# Patient Record
Sex: Male | Born: 1955 | Race: Black or African American | Hispanic: No | Marital: Married
Health system: Southern US, Community
[De-identification: ages and names within clinical notes are randomized; demographics above are authoritative.]

---

## 2007-07-07 ENCOUNTER — Emergency Department: Payer: Self-pay | Admitting: Emergency Medicine

## 2007-07-30 ENCOUNTER — Ambulatory Visit: Payer: Self-pay

## 2012-03-11 ENCOUNTER — Ambulatory Visit: Payer: Self-pay | Admitting: Internal Medicine

## 2012-03-23 ENCOUNTER — Inpatient Hospital Stay: Payer: Self-pay | Admitting: Surgery

## 2012-03-23 LAB — BASIC METABOLIC PANEL
Anion Gap: 9 (ref 7–16)
Co2: 25 mmol/L (ref 21–32)
Creatinine: 0.98 mg/dL (ref 0.60–1.30)
EGFR (African American): >60
EGFR (Non-African Amer.): >60
Glucose: 93 mg/dL (ref 65–99)
Potassium: 4.2 mmol/L (ref 3.5–5.1)
Sodium: 139 mmol/L (ref 136–145)

## 2012-03-23 LAB — CBC
HGB: 17.2 g/dL (ref 13.0–18.0)
Platelet: 297 10*3/uL (ref 150–440)
RBC: 6.16 10*6/uL — ABNORMAL HIGH (ref 4.40–5.90)

## 2012-03-24 LAB — CBC WITH DIFFERENTIAL/PLATELET
Basophil #: 0.1 10*3/uL (ref 0.0–0.1)
Eosinophil %: 1 %
Lymphocyte %: 12.8 %
MCH: 27.5 pg (ref 26.0–34.0)
MCHC: 33.4 g/dL (ref 32.0–36.0)
Monocyte %: 3 %
Neutrophil #: 17 10*3/uL — ABNORMAL HIGH (ref 1.4–6.5)
Platelet: 281 10*3/uL (ref 150–440)
RBC: 6.24 10*6/uL — ABNORMAL HIGH (ref 4.40–5.90)
RDW: 14.5 % (ref 11.5–14.5)

## 2012-03-24 LAB — COMPREHENSIVE METABOLIC PANEL
Albumin: 2.9 g/dL — ABNORMAL LOW (ref 3.4–5.0)
Alkaline Phosphatase: 98 U/L (ref 50–136)
Bilirubin,Total: 0.8 mg/dL (ref 0.2–1.0)
Calcium, Total: 8.3 mg/dL — ABNORMAL LOW (ref 8.5–10.1)
Chloride: 107 mmol/L (ref 98–107)
Co2: 23 mmol/L (ref 21–32)
Creatinine: 0.97 mg/dL (ref 0.60–1.30)
EGFR (African American): >60
EGFR (Non-African Amer.): >60
Glucose: 100 mg/dL — ABNORMAL HIGH (ref 65–99)
SGPT (ALT): 25 U/L (ref 12–78)

## 2012-03-24 LAB — PROTIME-INR: Prothrombin Time: 14.2 secs (ref 11.5–14.7)

## 2012-03-25 LAB — COMPREHENSIVE METABOLIC PANEL
Albumin: 2.9 g/dL — ABNORMAL LOW (ref 3.4–5.0)
Alkaline Phosphatase: 86 U/L (ref 50–136)
Anion Gap: 4 — ABNORMAL LOW (ref 7–16)
Calcium, Total: 8.8 mg/dL (ref 8.5–10.1)
Chloride: 104 mmol/L (ref 98–107)
Co2: 30 mmol/L (ref 21–32)
EGFR (African American): >60
EGFR (Non-African Amer.): >60
Glucose: 101 mg/dL — ABNORMAL HIGH (ref 65–99)
Osmolality: 275 (ref 275–301)
Potassium: 4.2 mmol/L (ref 3.5–5.1)
SGOT(AST): 16 U/L (ref 15–37)
Sodium: 138 mmol/L (ref 136–145)

## 2012-03-25 LAB — CBC WITH DIFFERENTIAL/PLATELET
Basophil #: 0.1 10*3/uL (ref 0.0–0.1)
Eosinophil #: 0.3 10*3/uL (ref 0.0–0.7)
Eosinophil %: 4 %
Lymphocyte #: 2.6 10*3/uL (ref 1.0–3.6)
Lymphocyte %: 34.7 %
MCH: 27.4 pg (ref 26.0–34.0)
MCHC: 33 g/dL (ref 32.0–36.0)
Monocyte #: 0.6 x10 3/mm (ref 0.2–1.0)
Neutrophil %: 52.6 %
Platelet: 264 10*3/uL (ref 150–440)
RDW: 14.3 % (ref 11.5–14.5)

## 2012-03-30 LAB — CBC WITH DIFFERENTIAL/PLATELET
Eosinophil %: 7.5 %
HCT: 50.1 % (ref 40.0–52.0)
HGB: 16.6 g/dL (ref 13.0–18.0)
Lymphocyte #: 2.5 10*3/uL (ref 1.0–3.6)
MCH: 27.1 pg (ref 26.0–34.0)
MCHC: 33.1 g/dL (ref 32.0–36.0)
MCV: 82 fL (ref 80–100)
Monocyte #: 0.5 x10 3/mm (ref 0.2–1.0)
Monocyte %: 6.8 %
Neutrophil #: 3.2 10*3/uL (ref 1.4–6.5)
Platelet: 337 10*3/uL (ref 150–440)
RBC: 6.12 10*6/uL — ABNORMAL HIGH (ref 4.40–5.90)
WBC: 6.7 10*3/uL (ref 3.8–10.6)

## 2012-03-30 LAB — BASIC METABOLIC PANEL
Calcium, Total: 9.1 mg/dL (ref 8.5–10.1)
Co2: 31 mmol/L (ref 21–32)
EGFR (African American): >60
EGFR (Non-African Amer.): >60
Potassium: 4 mmol/L (ref 3.5–5.1)
Sodium: 138 mmol/L (ref 136–145)

## 2012-04-02 DIAGNOSIS — I472 Ventricular tachycardia: Secondary | ICD-10-CM

## 2012-04-02 LAB — BASIC METABOLIC PANEL
Anion Gap: 5 — ABNORMAL LOW (ref 7–16)
BUN: 24 mg/dL — ABNORMAL HIGH (ref 7–18)
Calcium, Total: 9.2 mg/dL (ref 8.5–10.1)
Chloride: 103 mmol/L (ref 98–107)
Co2: 28 mmol/L (ref 21–32)
Creatinine: 1.1 mg/dL (ref 0.60–1.30)
EGFR (African American): >60
Sodium: 136 mmol/L (ref 136–145)

## 2012-04-02 LAB — CBC WITH DIFFERENTIAL/PLATELET
Basophil #: 0.1 10*3/uL (ref 0.0–0.1)
Basophil %: 0.7 %
Eosinophil #: 0.4 10*3/uL (ref 0.0–0.7)
Eosinophil %: 2.7 %
HCT: 53.3 % — ABNORMAL HIGH (ref 40.0–52.0)
HGB: 17.8 g/dL (ref 13.0–18.0)
Lymphocyte #: 2.2 10*3/uL (ref 1.0–3.6)
MCH: 27.7 pg (ref 26.0–34.0)
MCV: 83 fL (ref 80–100)
Monocyte #: 0.2 x10 3/mm (ref 0.2–1.0)
Neutrophil #: 12.2 10*3/uL — ABNORMAL HIGH (ref 1.4–6.5)
RDW: 14.5 % (ref 11.5–14.5)

## 2012-04-03 LAB — PATHOLOGY REPORT

## 2012-04-03 LAB — POTASSIUM: Potassium: 4.1 mmol/L (ref 3.5–5.1)

## 2012-04-07 LAB — BASIC METABOLIC PANEL
Anion Gap: 5 — ABNORMAL LOW (ref 7–16)
BUN: 9 mg/dL (ref 7–18)
Calcium, Total: 8.4 mg/dL — ABNORMAL LOW (ref 8.5–10.1)
Chloride: 105 mmol/L (ref 98–107)
Co2: 31 mmol/L (ref 21–32)
Creatinine: 0.88 mg/dL (ref 0.60–1.30)
EGFR (African American): >60
EGFR (Non-African Amer.): >60
Sodium: 141 mmol/L (ref 136–145)

## 2012-04-07 LAB — CBC WITH DIFFERENTIAL/PLATELET
Basophil %: 1.2 %
Eosinophil %: 13.4 %
HGB: 12.9 g/dL — ABNORMAL LOW (ref 13.0–18.0)
Lymphocyte #: 2.3 10*3/uL (ref 1.0–3.6)
MCH: 26.2 pg (ref 26.0–34.0)
MCV: 82 fL (ref 80–100)
Monocyte #: 0.5 x10 3/mm (ref 0.2–1.0)
Platelet: 351 10*3/uL (ref 150–440)
RBC: 4.92 10*6/uL (ref 4.40–5.90)
RDW: 14.1 % (ref 11.5–14.5)

## 2012-04-11 ENCOUNTER — Ambulatory Visit: Payer: Self-pay | Admitting: Internal Medicine

## 2012-07-09 ENCOUNTER — Emergency Department: Payer: Self-pay | Admitting: Unknown Physician Specialty

## 2013-07-10 ENCOUNTER — Ambulatory Visit: Payer: Self-pay | Admitting: Internal Medicine

## 2013-07-21 ENCOUNTER — Inpatient Hospital Stay: Payer: Self-pay | Admitting: Internal Medicine

## 2013-07-21 LAB — CBC
HCT: 45.7 % (ref 40.0–52.0)
HGB: 14.8 g/dL (ref 13.0–18.0)
MCH: 24.8 pg — ABNORMAL LOW (ref 26.0–34.0)
MCHC: 32.3 g/dL (ref 32.0–36.0)
MCV: 77 fL — AB (ref 80–100)
Platelet: 357 10*3/uL (ref 150–440)
RBC: 5.96 10*6/uL — ABNORMAL HIGH (ref 4.40–5.90)
RDW: 18.9 % — ABNORMAL HIGH (ref 11.5–14.5)
WBC: 7.1 10*3/uL (ref 3.8–10.6)

## 2013-07-21 LAB — COMPREHENSIVE METABOLIC PANEL
ALBUMIN: 3.3 g/dL — AB (ref 3.4–5.0)
Alkaline Phosphatase: 111 U/L
Anion Gap: 7 (ref 7–16)
BUN: 13 mg/dL (ref 7–18)
Bilirubin,Total: 0.6 mg/dL (ref 0.2–1.0)
CO2: 29 mmol/L (ref 21–32)
Calcium, Total: 9.8 mg/dL (ref 8.5–10.1)
Chloride: 99 mmol/L (ref 98–107)
Creatinine: 0.85 mg/dL (ref 0.60–1.30)
EGFR (Non-African Amer.): >60
GLUCOSE: 119 mg/dL — AB (ref 65–99)
Osmolality: 271 (ref 275–301)
POTASSIUM: 4.3 mmol/L (ref 3.5–5.1)
SGOT(AST): 49 U/L — ABNORMAL HIGH (ref 15–37)
SGPT (ALT): 48 U/L (ref 12–78)
Sodium: 135 mmol/L — ABNORMAL LOW (ref 136–145)
TOTAL PROTEIN: 8.2 g/dL (ref 6.4–8.2)

## 2013-07-21 LAB — TROPONIN I

## 2013-07-22 LAB — CBC WITH DIFFERENTIAL/PLATELET
Basophil #: 0 10*3/uL (ref 0.0–0.1)
Basophil %: 0.5 %
EOS PCT: 2.5 %
Eosinophil #: 0.1 10*3/uL (ref 0.0–0.7)
HCT: 36.3 % — ABNORMAL LOW (ref 40.0–52.0)
HGB: 11.7 g/dL — ABNORMAL LOW (ref 13.0–18.0)
LYMPHS PCT: 5.4 %
Lymphocyte #: 0.2 10*3/uL — ABNORMAL LOW (ref 1.0–3.6)
MCH: 24.6 pg — ABNORMAL LOW (ref 26.0–34.0)
MCHC: 32.2 g/dL (ref 32.0–36.0)
MCV: 77 fL — ABNORMAL LOW (ref 80–100)
Monocyte #: 0.4 x10 3/mm (ref 0.2–1.0)
Monocyte %: 9.8 %
Neutrophil #: 3.5 10*3/uL (ref 1.4–6.5)
Neutrophil %: 81.8 %
PLATELETS: 237 10*3/uL (ref 150–440)
RBC: 4.75 10*6/uL (ref 4.40–5.90)
RDW: 18.1 % — AB (ref 11.5–14.5)
WBC: 4.3 10*3/uL (ref 3.8–10.6)

## 2013-07-22 LAB — MAGNESIUM: Magnesium: 1.5 mg/dL — ABNORMAL LOW

## 2013-07-23 LAB — CREATININE, SERUM
CREATININE: 1.04 mg/dL (ref 0.60–1.30)
EGFR (African American): >60

## 2013-07-24 LAB — VANCOMYCIN, TROUGH: VANCOMYCIN, TROUGH: 34 ug/mL — AB (ref 10–20)

## 2013-07-25 LAB — BASIC METABOLIC PANEL
ANION GAP: 8 (ref 7–16)
BUN: 32 mg/dL — AB (ref 7–18)
CHLORIDE: 105 mmol/L (ref 98–107)
Calcium, Total: 8.8 mg/dL (ref 8.5–10.1)
Co2: 26 mmol/L (ref 21–32)
Creatinine: 4.09 mg/dL — ABNORMAL HIGH (ref 0.60–1.30)
EGFR (African American): 18 — ABNORMAL LOW
EGFR (Non-African Amer.): 15 — ABNORMAL LOW
GLUCOSE: 113 mg/dL — AB (ref 65–99)
Osmolality: 285 (ref 275–301)
POTASSIUM: 4.4 mmol/L (ref 3.5–5.1)
SODIUM: 139 mmol/L (ref 136–145)

## 2013-07-25 LAB — PROTEIN / CREATININE RATIO, URINE
Creatinine, Urine: 46.3 mg/dL (ref 30.0–125.0)
Protein, Random Urine: 22 mg/dL — ABNORMAL HIGH (ref 0–12)
Protein/Creat. Ratio: 475 mg/gCREAT — ABNORMAL HIGH (ref 0–200)

## 2013-07-25 LAB — URINALYSIS, COMPLETE
BILIRUBIN, UR: NEGATIVE
Glucose,UR: NEGATIVE mg/dL (ref 0–75)
Ketone: NEGATIVE
LEUKOCYTE ESTERASE: NEGATIVE
Nitrite: NEGATIVE
PROTEIN: NEGATIVE
Ph: 5 (ref 4.5–8.0)
RBC,UR: <1 /HPF (ref 0–5)
SPECIFIC GRAVITY: 1.006 (ref 1.003–1.030)
Squamous Epithelial: <1
WBC UR: NONE SEEN /HPF (ref 0–5)

## 2013-07-25 LAB — CREATININE, SERUM
Creatinine: 3.9 mg/dL — ABNORMAL HIGH (ref 0.60–1.30)
EGFR (African American): 19 — ABNORMAL LOW
EGFR (Non-African Amer.): 16 — ABNORMAL LOW

## 2013-07-25 LAB — VANCOMYCIN, TROUGH: Vancomycin, Trough: 18 ug/mL (ref 10–20)

## 2013-07-26 LAB — CBC WITH DIFFERENTIAL/PLATELET
BASOS PCT: 0 %
Basophil #: 0 10*3/uL (ref 0.0–0.1)
Eosinophil #: 0 10*3/uL (ref 0.0–0.7)
Eosinophil %: 0.1 %
HCT: 36.6 % — AB (ref 40.0–52.0)
HGB: 11.7 g/dL — ABNORMAL LOW (ref 13.0–18.0)
Lymphocyte #: 0.1 10*3/uL — ABNORMAL LOW (ref 1.0–3.6)
Lymphocyte %: 1.7 %
MCH: 24.4 pg — ABNORMAL LOW (ref 26.0–34.0)
MCHC: 32 g/dL (ref 32.0–36.0)
MCV: 76 fL — ABNORMAL LOW (ref 80–100)
MONOS PCT: 1.5 %
Monocyte #: 0.1 x10 3/mm — ABNORMAL LOW (ref 0.2–1.0)
Neutrophil #: 7 10*3/uL — ABNORMAL HIGH (ref 1.4–6.5)
Neutrophil %: 96.7 %
PLATELETS: 206 10*3/uL (ref 150–440)
RBC: 4.8 10*6/uL (ref 4.40–5.90)
RDW: 18.6 % — AB (ref 11.5–14.5)
WBC: 7.2 10*3/uL (ref 3.8–10.6)

## 2013-07-26 LAB — UR PROT ELECTROPHORESIS, URINE RANDOM

## 2013-07-26 LAB — BASIC METABOLIC PANEL
Anion Gap: 10 (ref 7–16)
BUN: 44 mg/dL — ABNORMAL HIGH (ref 7–18)
CALCIUM: 9.1 mg/dL (ref 8.5–10.1)
CHLORIDE: 102 mmol/L (ref 98–107)
CREATININE: 4.58 mg/dL — AB (ref 0.60–1.30)
Co2: 24 mmol/L (ref 21–32)
EGFR (African American): 15 — ABNORMAL LOW
EGFR (Non-African Amer.): 13 — ABNORMAL LOW
GLUCOSE: 138 mg/dL — AB (ref 65–99)
Osmolality: 285 (ref 275–301)
POTASSIUM: 4.7 mmol/L (ref 3.5–5.1)
Sodium: 136 mmol/L (ref 136–145)

## 2013-07-26 LAB — CULTURE, BLOOD (SINGLE)

## 2013-07-27 LAB — BASIC METABOLIC PANEL
ANION GAP: 11 (ref 7–16)
BUN: 57 mg/dL — ABNORMAL HIGH (ref 7–18)
CREATININE: 4.15 mg/dL — AB (ref 0.60–1.30)
Calcium, Total: 8.5 mg/dL (ref 8.5–10.1)
Chloride: 106 mmol/L (ref 98–107)
Co2: 18 mmol/L — ABNORMAL LOW (ref 21–32)
EGFR (African American): 17 — ABNORMAL LOW
EGFR (Non-African Amer.): 15 — ABNORMAL LOW
GLUCOSE: 156 mg/dL — AB (ref 65–99)
Osmolality: 289 (ref 275–301)
Potassium: 4.7 mmol/L (ref 3.5–5.1)
SODIUM: 135 mmol/L — AB (ref 136–145)

## 2013-07-28 LAB — BASIC METABOLIC PANEL
Anion Gap: 9 (ref 7–16)
BUN: 64 mg/dL — ABNORMAL HIGH (ref 7–18)
CALCIUM: 8.7 mg/dL (ref 8.5–10.1)
CHLORIDE: 108 mmol/L — AB (ref 98–107)
Co2: 21 mmol/L (ref 21–32)
Creatinine: 3.93 mg/dL — ABNORMAL HIGH (ref 0.60–1.30)
EGFR (African American): 18 — ABNORMAL LOW
EGFR (Non-African Amer.): 16 — ABNORMAL LOW
Glucose: 114 mg/dL — ABNORMAL HIGH (ref 65–99)
Osmolality: 295 (ref 275–301)
Potassium: 4.7 mmol/L (ref 3.5–5.1)
SODIUM: 138 mmol/L (ref 136–145)

## 2013-07-29 LAB — BASIC METABOLIC PANEL
ANION GAP: 9 (ref 7–16)
BUN: 64 mg/dL — ABNORMAL HIGH (ref 7–18)
CALCIUM: 8.9 mg/dL (ref 8.5–10.1)
CO2: 21 mmol/L (ref 21–32)
Chloride: 110 mmol/L — ABNORMAL HIGH (ref 98–107)
Creatinine: 3.38 mg/dL — ABNORMAL HIGH (ref 0.60–1.30)
EGFR (African American): 22 — ABNORMAL LOW
EGFR (Non-African Amer.): 19 — ABNORMAL LOW
Glucose: 117 mg/dL — ABNORMAL HIGH (ref 65–99)
OSMOLALITY: 299 (ref 275–301)
Potassium: 5.1 mmol/L (ref 3.5–5.1)
Sodium: 140 mmol/L (ref 136–145)

## 2013-07-29 LAB — PROTEIN ELECTROPHORESIS(ARMC)

## 2013-07-30 LAB — BASIC METABOLIC PANEL
ANION GAP: 9 (ref 7–16)
BUN: 63 mg/dL — AB (ref 7–18)
Calcium, Total: 9.3 mg/dL (ref 8.5–10.1)
Chloride: 112 mmol/L — ABNORMAL HIGH (ref 98–107)
Co2: 21 mmol/L (ref 21–32)
Creatinine: 3.09 mg/dL — ABNORMAL HIGH (ref 0.60–1.30)
EGFR (Non-African Amer.): 21 — ABNORMAL LOW
GFR CALC AF AMER: 25 — AB
GLUCOSE: 105 mg/dL — AB (ref 65–99)
Osmolality: 301 (ref 275–301)
Potassium: 4.6 mmol/L (ref 3.5–5.1)
SODIUM: 142 mmol/L (ref 136–145)

## 2013-07-31 LAB — BASIC METABOLIC PANEL
Anion Gap: 7 (ref 7–16)
BUN: 54 mg/dL — ABNORMAL HIGH (ref 7–18)
CHLORIDE: 111 mmol/L — AB (ref 98–107)
CO2: 23 mmol/L (ref 21–32)
CREATININE: 2.66 mg/dL — AB (ref 0.60–1.30)
Calcium, Total: 9.2 mg/dL (ref 8.5–10.1)
EGFR (Non-African Amer.): 25 — ABNORMAL LOW
GFR CALC AF AMER: 30 — AB
GLUCOSE: 107 mg/dL — AB (ref 65–99)
Osmolality: 296 (ref 275–301)
Potassium: 4.2 mmol/L (ref 3.5–5.1)
Sodium: 141 mmol/L (ref 136–145)

## 2013-08-05 ENCOUNTER — Inpatient Hospital Stay: Payer: Self-pay | Admitting: Internal Medicine

## 2013-08-05 LAB — CBC WITH DIFFERENTIAL/PLATELET
BASOS PCT: 0.9 %
Basophil #: 0.1 10*3/uL (ref 0.0–0.1)
EOS PCT: 0.2 %
Eosinophil #: 0 10*3/uL (ref 0.0–0.7)
HCT: 33.3 % — AB (ref 40.0–52.0)
HGB: 10.7 g/dL — ABNORMAL LOW (ref 13.0–18.0)
Lymphocyte #: 0.1 10*3/uL — ABNORMAL LOW (ref 1.0–3.6)
Lymphocyte %: 0.9 %
MCH: 24.9 pg — AB (ref 26.0–34.0)
MCHC: 32.2 g/dL (ref 32.0–36.0)
MCV: 77 fL — ABNORMAL LOW (ref 80–100)
Monocyte #: 0.4 x10 3/mm (ref 0.2–1.0)
Monocyte %: 3.1 %
NEUTROS PCT: 94.9 %
Neutrophil #: 13.4 10*3/uL — ABNORMAL HIGH (ref 1.4–6.5)
Platelet: 113 10*3/uL — ABNORMAL LOW (ref 150–440)
RBC: 4.31 10*6/uL — ABNORMAL LOW (ref 4.40–5.90)
RDW: 19.3 % — AB (ref 11.5–14.5)
WBC: 14.1 10*3/uL — ABNORMAL HIGH (ref 3.8–10.6)

## 2013-08-05 LAB — BASIC METABOLIC PANEL
ANION GAP: 11 (ref 7–16)
BUN: 30 mg/dL — ABNORMAL HIGH (ref 7–18)
CHLORIDE: 103 mmol/L (ref 98–107)
Calcium, Total: 9.5 mg/dL (ref 8.5–10.1)
Co2: 27 mmol/L (ref 21–32)
Creatinine: 1.69 mg/dL — ABNORMAL HIGH (ref 0.60–1.30)
EGFR (Non-African Amer.): 44 — ABNORMAL LOW
GFR CALC AF AMER: 51 — AB
GLUCOSE: 154 mg/dL — AB (ref 65–99)
OSMOLALITY: 291 (ref 275–301)
Potassium: 3.5 mmol/L (ref 3.5–5.1)
Sodium: 141 mmol/L (ref 136–145)

## 2013-08-05 LAB — PROTIME-INR
INR: 1.4
Prothrombin Time: 17.1 secs — ABNORMAL HIGH (ref 11.5–14.7)

## 2013-08-05 LAB — APTT: Activated PTT: 30.5 secs (ref 23.6–35.9)

## 2013-08-06 LAB — CBC WITH DIFFERENTIAL/PLATELET
Basophil #: 0.1 10*3/uL (ref 0.0–0.1)
Basophil %: 1.1 %
EOS ABS: 0 10*3/uL (ref 0.0–0.7)
Eosinophil %: 0.2 %
HCT: 34.7 % — AB (ref 40.0–52.0)
HGB: 11.1 g/dL — AB (ref 13.0–18.0)
Lymphocyte #: 0.2 10*3/uL — ABNORMAL LOW (ref 1.0–3.6)
Lymphocyte %: 1.5 %
MCH: 24.7 pg — AB (ref 26.0–34.0)
MCHC: 31.9 g/dL — AB (ref 32.0–36.0)
MCV: 78 fL — ABNORMAL LOW (ref 80–100)
MONO ABS: 0.5 x10 3/mm (ref 0.2–1.0)
MONOS PCT: 3.5 %
NEUTROS ABS: 12.5 10*3/uL — AB (ref 1.4–6.5)
Neutrophil %: 93.7 %
PLATELETS: 119 10*3/uL — AB (ref 150–440)
RBC: 4.48 10*6/uL (ref 4.40–5.90)
RDW: 19 % — ABNORMAL HIGH (ref 11.5–14.5)
WBC: 13.3 10*3/uL — AB (ref 3.8–10.6)

## 2013-08-06 LAB — APTT
Activated PTT: 43.2 secs — ABNORMAL HIGH (ref 23.6–35.9)
Activated PTT: 36.7 secs — ABNORMAL HIGH (ref 23.6–35.9)
Activated PTT: 42.5 secs — ABNORMAL HIGH (ref 23.6–35.9)

## 2013-08-06 LAB — BASIC METABOLIC PANEL
ANION GAP: 10 (ref 7–16)
BUN: 27 mg/dL — AB (ref 7–18)
CHLORIDE: 104 mmol/L (ref 98–107)
CREATININE: 1.7 mg/dL — AB (ref 0.60–1.30)
Calcium, Total: 9.8 mg/dL (ref 8.5–10.1)
Co2: 27 mmol/L (ref 21–32)
EGFR (African American): 51 — ABNORMAL LOW
EGFR (Non-African Amer.): 44 — ABNORMAL LOW
Glucose: 119 mg/dL — ABNORMAL HIGH (ref 65–99)
Osmolality: 288 (ref 275–301)
Potassium: 3.6 mmol/L (ref 3.5–5.1)
SODIUM: 141 mmol/L (ref 136–145)

## 2013-08-07 LAB — BASIC METABOLIC PANEL
ANION GAP: 12 (ref 7–16)
BUN: 26 mg/dL — AB (ref 7–18)
CHLORIDE: 103 mmol/L (ref 98–107)
CREATININE: 1.79 mg/dL — AB (ref 0.60–1.30)
Calcium, Total: 10.2 mg/dL — ABNORMAL HIGH (ref 8.5–10.1)
Co2: 26 mmol/L (ref 21–32)
EGFR (Non-African Amer.): 41 — ABNORMAL LOW
GFR CALC AF AMER: 48 — AB
GLUCOSE: 105 mg/dL — AB (ref 65–99)
Osmolality: 286 (ref 275–301)
Potassium: 3.9 mmol/L (ref 3.5–5.1)
Sodium: 141 mmol/L (ref 136–145)

## 2013-08-07 LAB — HEMOGLOBIN: HGB: 10.4 g/dL — AB (ref 13.0–18.0)

## 2013-08-07 LAB — APTT
ACTIVATED PTT: 43.4 s — AB (ref 23.6–35.9)
Activated PTT: 61.9 secs — ABNORMAL HIGH (ref 23.6–35.9)
Activated PTT: 74.9 secs — ABNORMAL HIGH (ref 23.6–35.9)

## 2013-08-07 LAB — PLATELET COUNT: PLATELETS: 137 10*3/uL — AB (ref 150–440)

## 2013-08-08 LAB — BASIC METABOLIC PANEL
Anion Gap: 8 (ref 7–16)
BUN: 28 mg/dL — ABNORMAL HIGH (ref 7–18)
CHLORIDE: 104 mmol/L (ref 98–107)
CO2: 26 mmol/L (ref 21–32)
Calcium, Total: 10.2 mg/dL — ABNORMAL HIGH (ref 8.5–10.1)
Creatinine: 1.64 mg/dL — ABNORMAL HIGH (ref 0.60–1.30)
EGFR (African American): 53 — ABNORMAL LOW
EGFR (Non-African Amer.): 46 — ABNORMAL LOW
Glucose: 144 mg/dL — ABNORMAL HIGH (ref 65–99)
OSMOLALITY: 284 (ref 275–301)
Potassium: 3.8 mmol/L (ref 3.5–5.1)
Sodium: 138 mmol/L (ref 136–145)

## 2013-08-08 LAB — HEPATIC FUNCTION PANEL A (ARMC)
Albumin: 1.7 g/dL — ABNORMAL LOW (ref 3.4–5.0)
Alkaline Phosphatase: 83 U/L
Bilirubin,Total: 0.4 mg/dL (ref 0.2–1.0)
SGOT(AST): 55 U/L — ABNORMAL HIGH (ref 15–37)
SGPT (ALT): 49 U/L (ref 12–78)
TOTAL PROTEIN: 6 g/dL — AB (ref 6.4–8.2)

## 2013-08-08 LAB — APTT: Activated PTT: 99.5 secs — ABNORMAL HIGH (ref 23.6–35.9)

## 2013-08-09 ENCOUNTER — Ambulatory Visit: Payer: Self-pay | Admitting: Internal Medicine

## 2013-08-09 LAB — CBC WITH DIFFERENTIAL/PLATELET
Basophil #: 0 10*3/uL (ref 0.0–0.1)
Basophil %: 0.3 %
Eosinophil #: 0 10*3/uL (ref 0.0–0.7)
Eosinophil %: 0.1 %
HCT: 31.4 % — ABNORMAL LOW (ref 40.0–52.0)
HGB: 10.1 g/dL — ABNORMAL LOW (ref 13.0–18.0)
Lymphocyte #: 0.3 10*3/uL — ABNORMAL LOW (ref 1.0–3.6)
Lymphocyte %: 2.1 %
MCH: 25 pg — AB (ref 26.0–34.0)
MCHC: 32.1 g/dL (ref 32.0–36.0)
MCV: 78 fL — ABNORMAL LOW (ref 80–100)
Monocyte #: 0.5 x10 3/mm (ref 0.2–1.0)
Monocyte %: 3.3 %
NEUTROS PCT: 94.2 %
Neutrophil #: 13.1 10*3/uL — ABNORMAL HIGH (ref 1.4–6.5)
Platelet: 156 10*3/uL (ref 150–440)
RBC: 4.03 10*6/uL — AB (ref 4.40–5.90)
RDW: 19.6 % — AB (ref 11.5–14.5)
WBC: 13.9 10*3/uL — AB (ref 3.8–10.6)

## 2013-08-09 LAB — KAPPA/LAMBDA FREE LIGHT CHAINS (ARMC)

## 2013-08-09 LAB — PROTIME-INR
INR: 1.6
PROTHROMBIN TIME: 18.3 s — AB (ref 11.5–14.7)

## 2013-08-09 LAB — APTT: Activated PTT: 90.4 secs — ABNORMAL HIGH (ref 23.6–35.9)

## 2013-08-10 LAB — APTT: Activated PTT: 33.2 secs (ref 23.6–35.9)

## 2013-08-12 DIAGNOSIS — R180 Malignant ascites: Secondary | ICD-10-CM

## 2013-08-14 ENCOUNTER — Encounter: Payer: Self-pay | Admitting: General Surgery

## 2013-09-09 ENCOUNTER — Ambulatory Visit: Payer: Self-pay | Admitting: Internal Medicine

## 2013-09-09 DEATH — deceased

## 2013-10-29 IMAGING — CR DG CHEST 2V
1 series · 2 of 2 positions shown · non-contrast
Comparison: none

REASON FOR EXAM: assess for pleural effusion
COMMENTS:

[Series 6: w chest pa · 0.14mm/px · 2 of 2 slices shown]
[im 1/2]
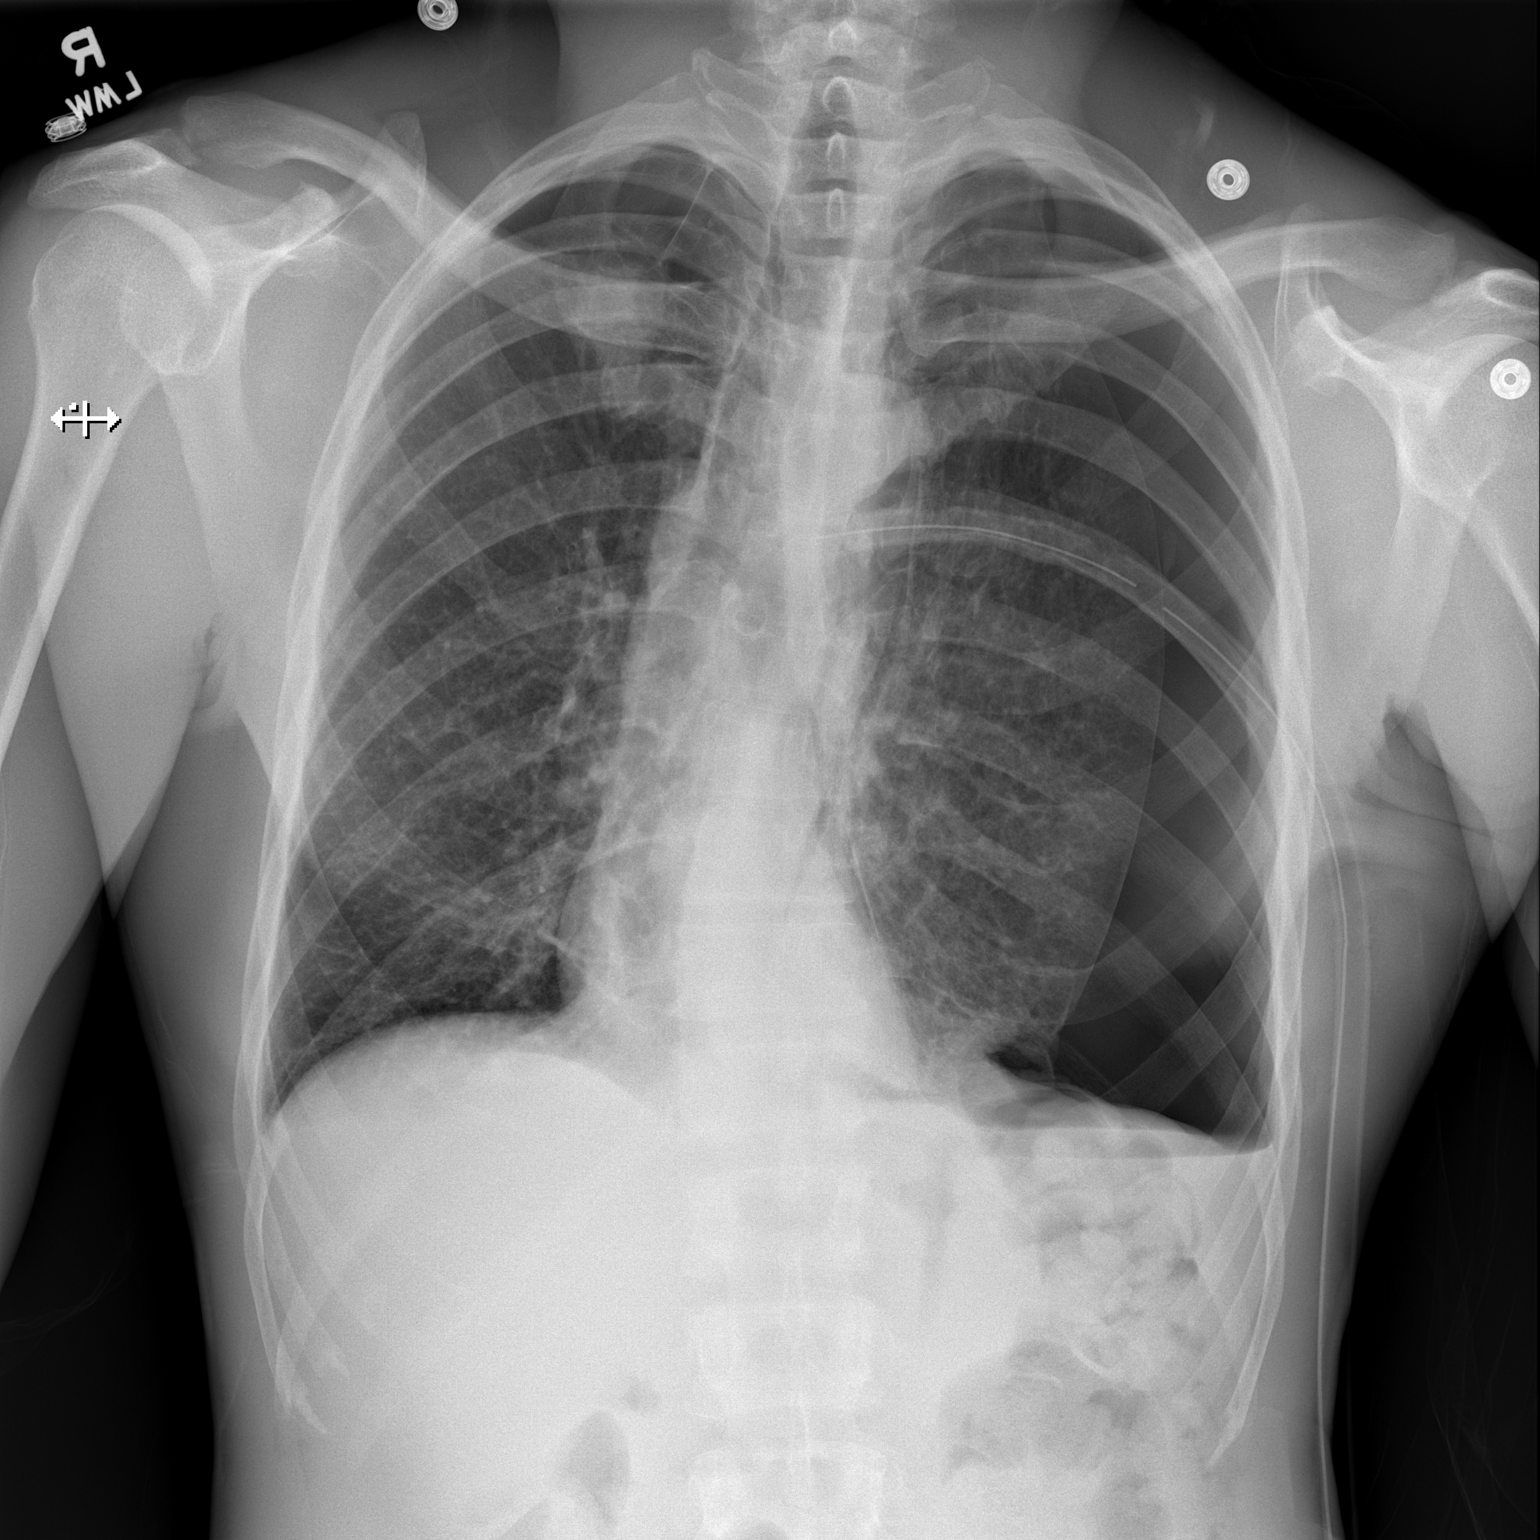
[im 2/2]
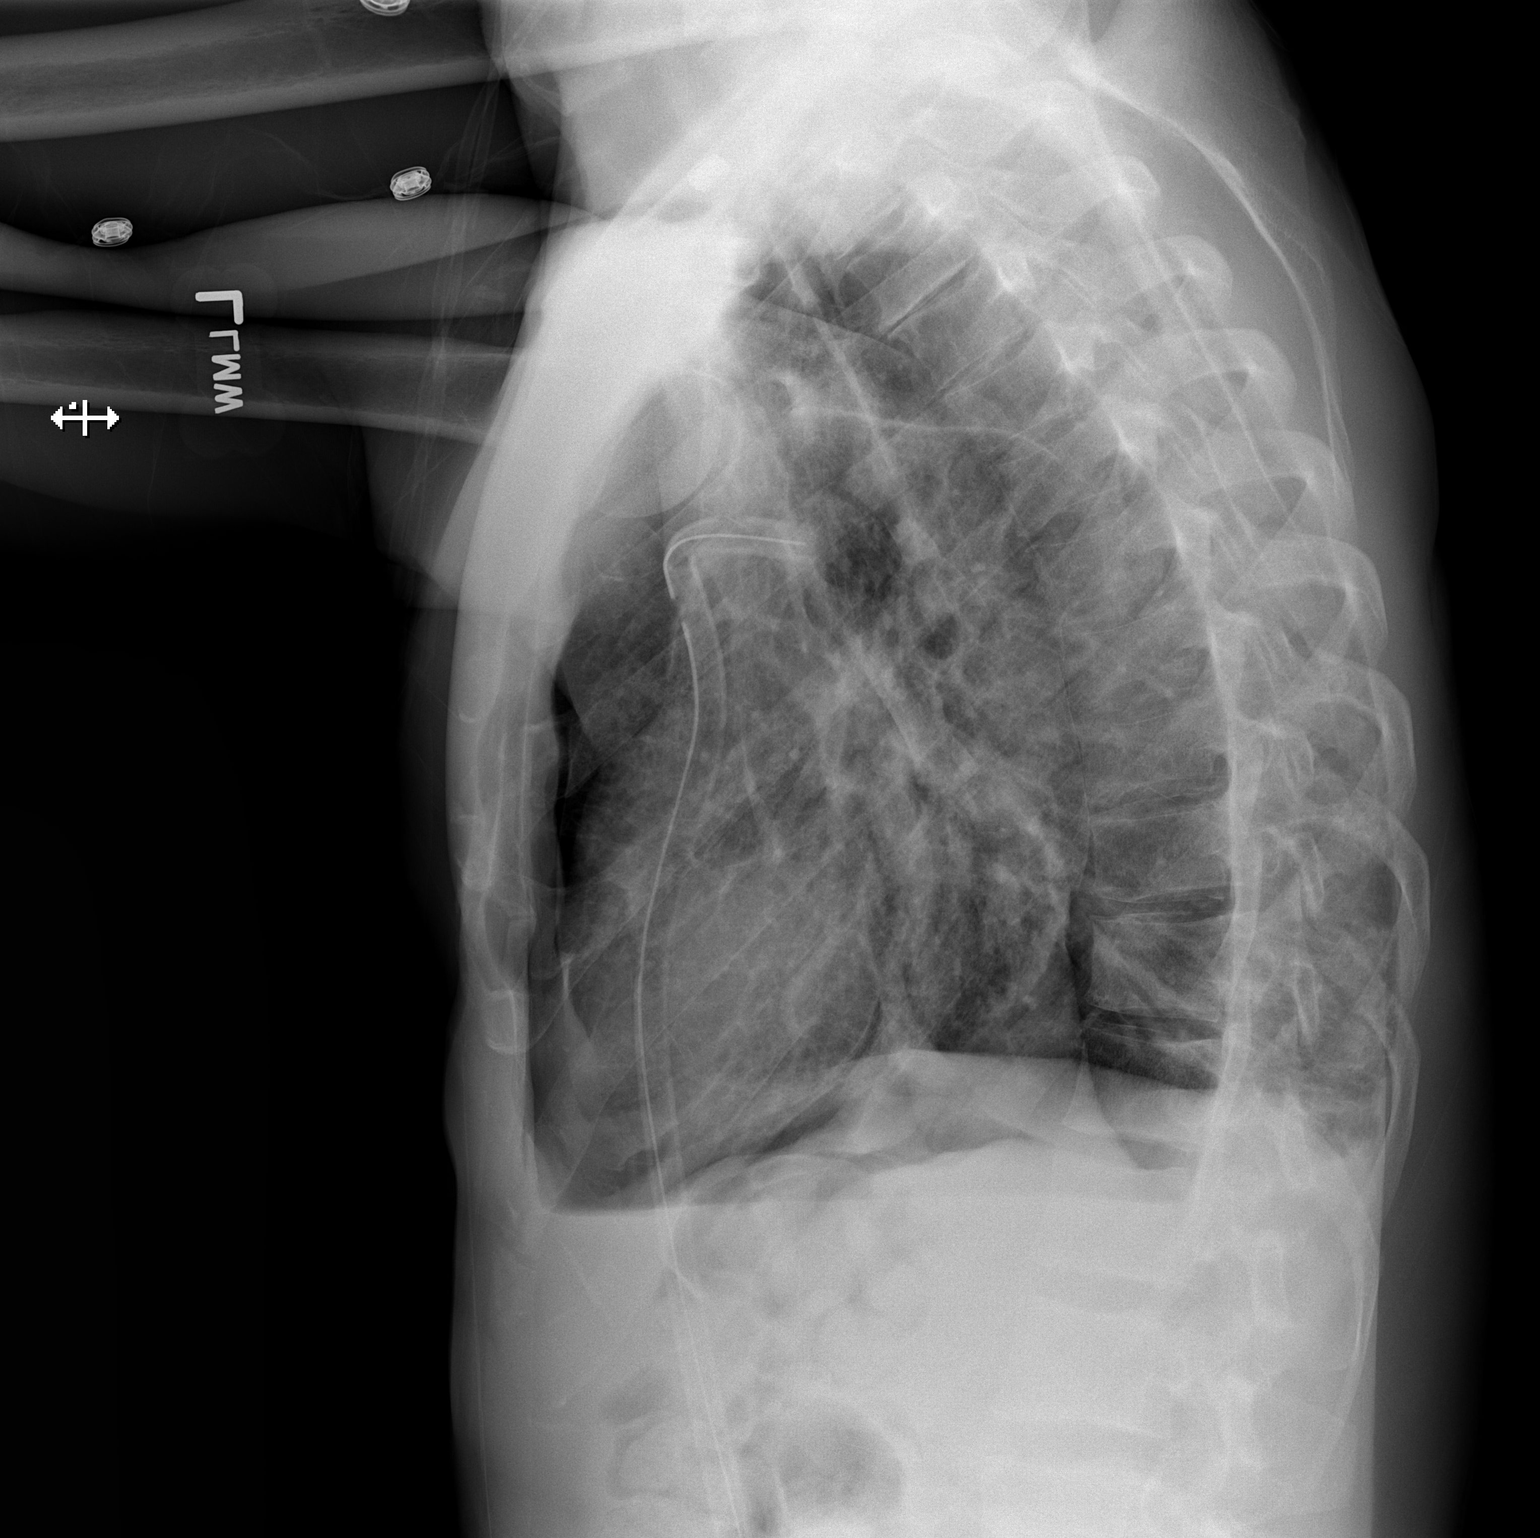

[2 of 2 positions shown; findings below may reference images not displayed]

PROCEDURE:     DXR - DXR CHEST PA (OR AP) AND LATERAL  - March 30, 2012  [DATE]

RESULT:     Comparison is made to the study March 29, 2012 [DATE] a.m.

The patient's pneumothorax on the left has increased further despite the
presence of the chest tube with the tip lying in the region of the AP
window. There is fluid in the pleural space resulting in a meniscus. Lung
volume loss to the pneumothorax is approximately 50%. There is mild shift of
the mediastinum toward the right.
IMPRESSION: There has been interval worsening of the pneumothorax on
the left such that it now amounts to approximately 50% of the lung volume.
Pleural fluid on the left is present and shift of the mediastinum toward the
right is present.

This report was discussed by me by telephone with Dr. Carolyne at [DATE] a.m.
on 30 March, 2012.

## 2013-11-01 IMAGING — CR DG CHEST 1V PORT
1 series · 1 of 1 positions shown · non-contrast
Comparison: none

REASON FOR EXAM: assess for pleural effusion
COMMENTS:

PROCEDURE:     DXR - DXR PORTABLE CHEST SINGLE VIEW  - April 02, 2012  [DATE]
RESULT:     Comparison: 04/01/2012

[ap]
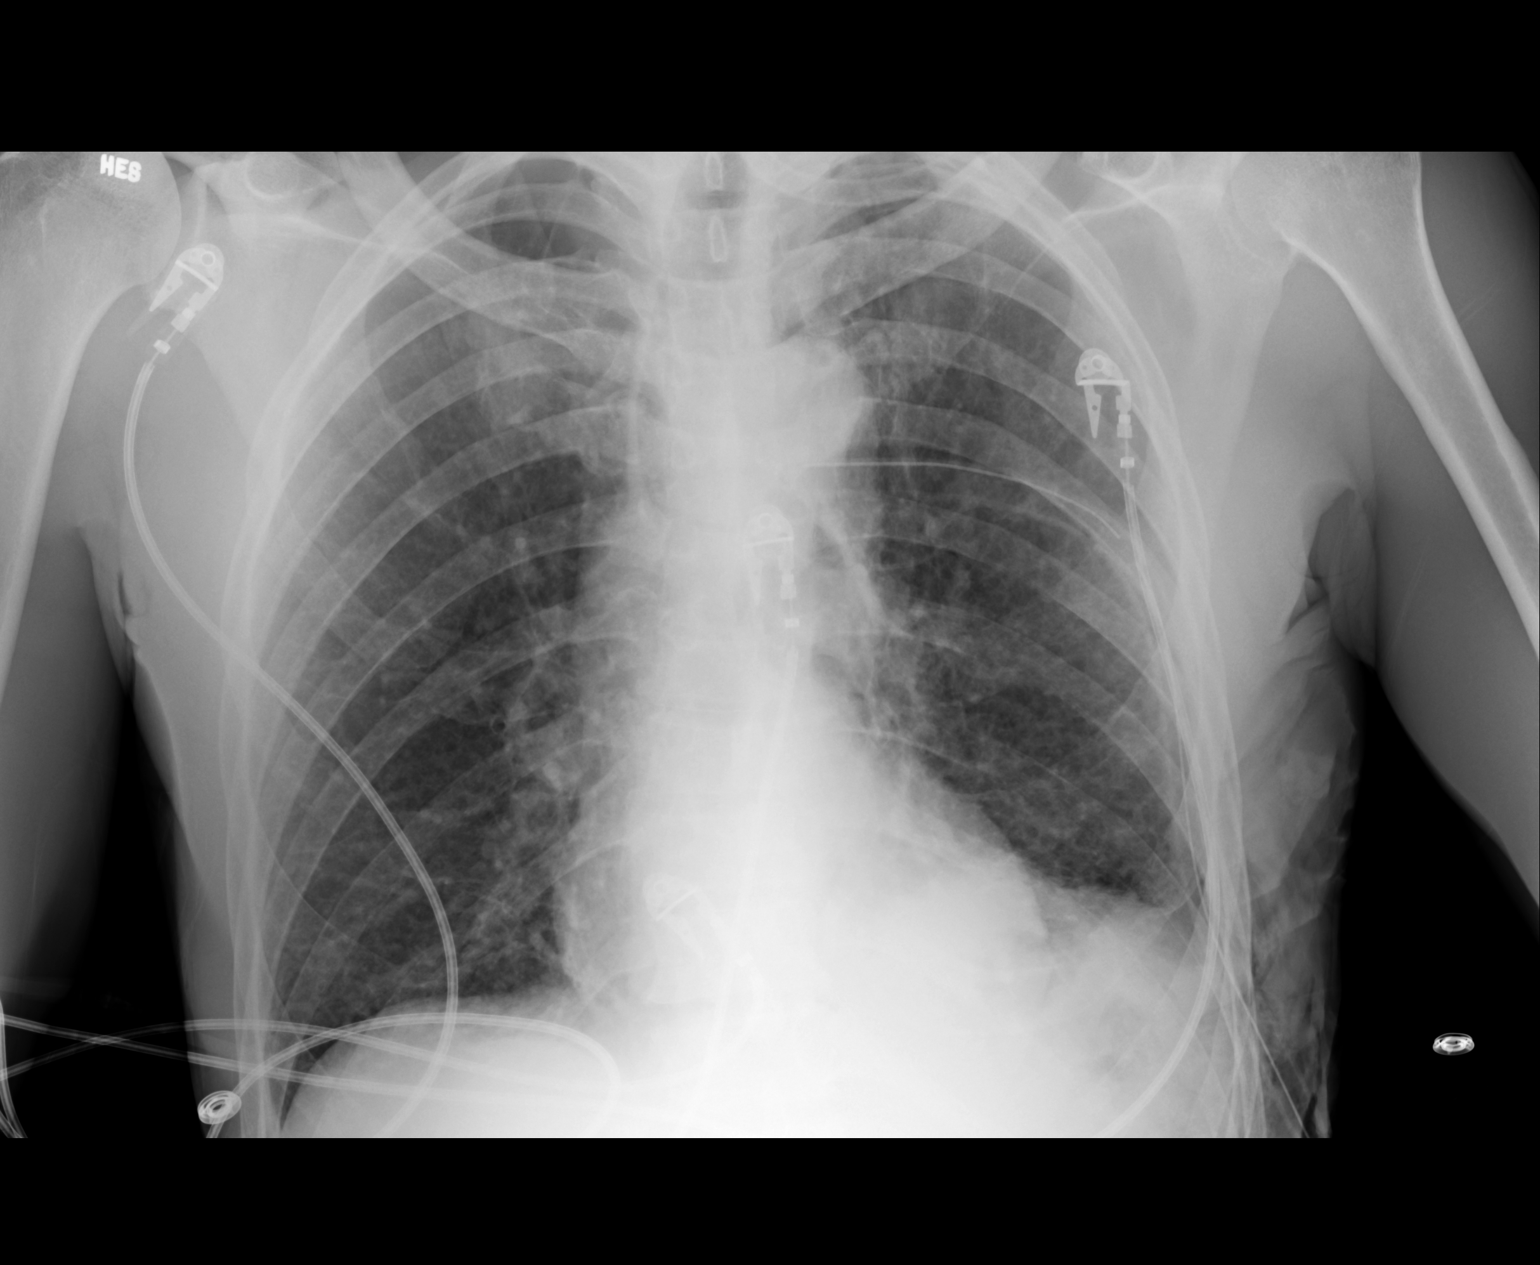

[1 of 1 positions shown; findings below may reference images not displayed]

FINDINGS: The heart and mediastinum are stable. Left-sided chest tube remains. No
definite pneumothorax. There are bilateral interstitial pulmonary opacities
which are new from prior. Mild elevation of the left the diaphragm is likely
related to atelectasis. Subcutaneous emphysema along the left lateral chest
wall is slightly increased from prior.
IMPRESSION: 1. No definite pneumothorax, with left chest tube in place.
2. Mild interstitial edema, new from prior.

[REDACTED]

## 2013-11-02 IMAGING — CR DG CHEST 1V PORT
1 series · 1 of 1 positions shown · non-contrast
Comparison: none

REASON FOR EXAM: Assess for Pleural Effusion
COMMENTS:

PROCEDURE:     DXR - DXR PORTABLE CHEST SINGLE VIEW  - April 03, 2012  [DATE]
RESULT:     Comparison: 04/02/2012

[ap]
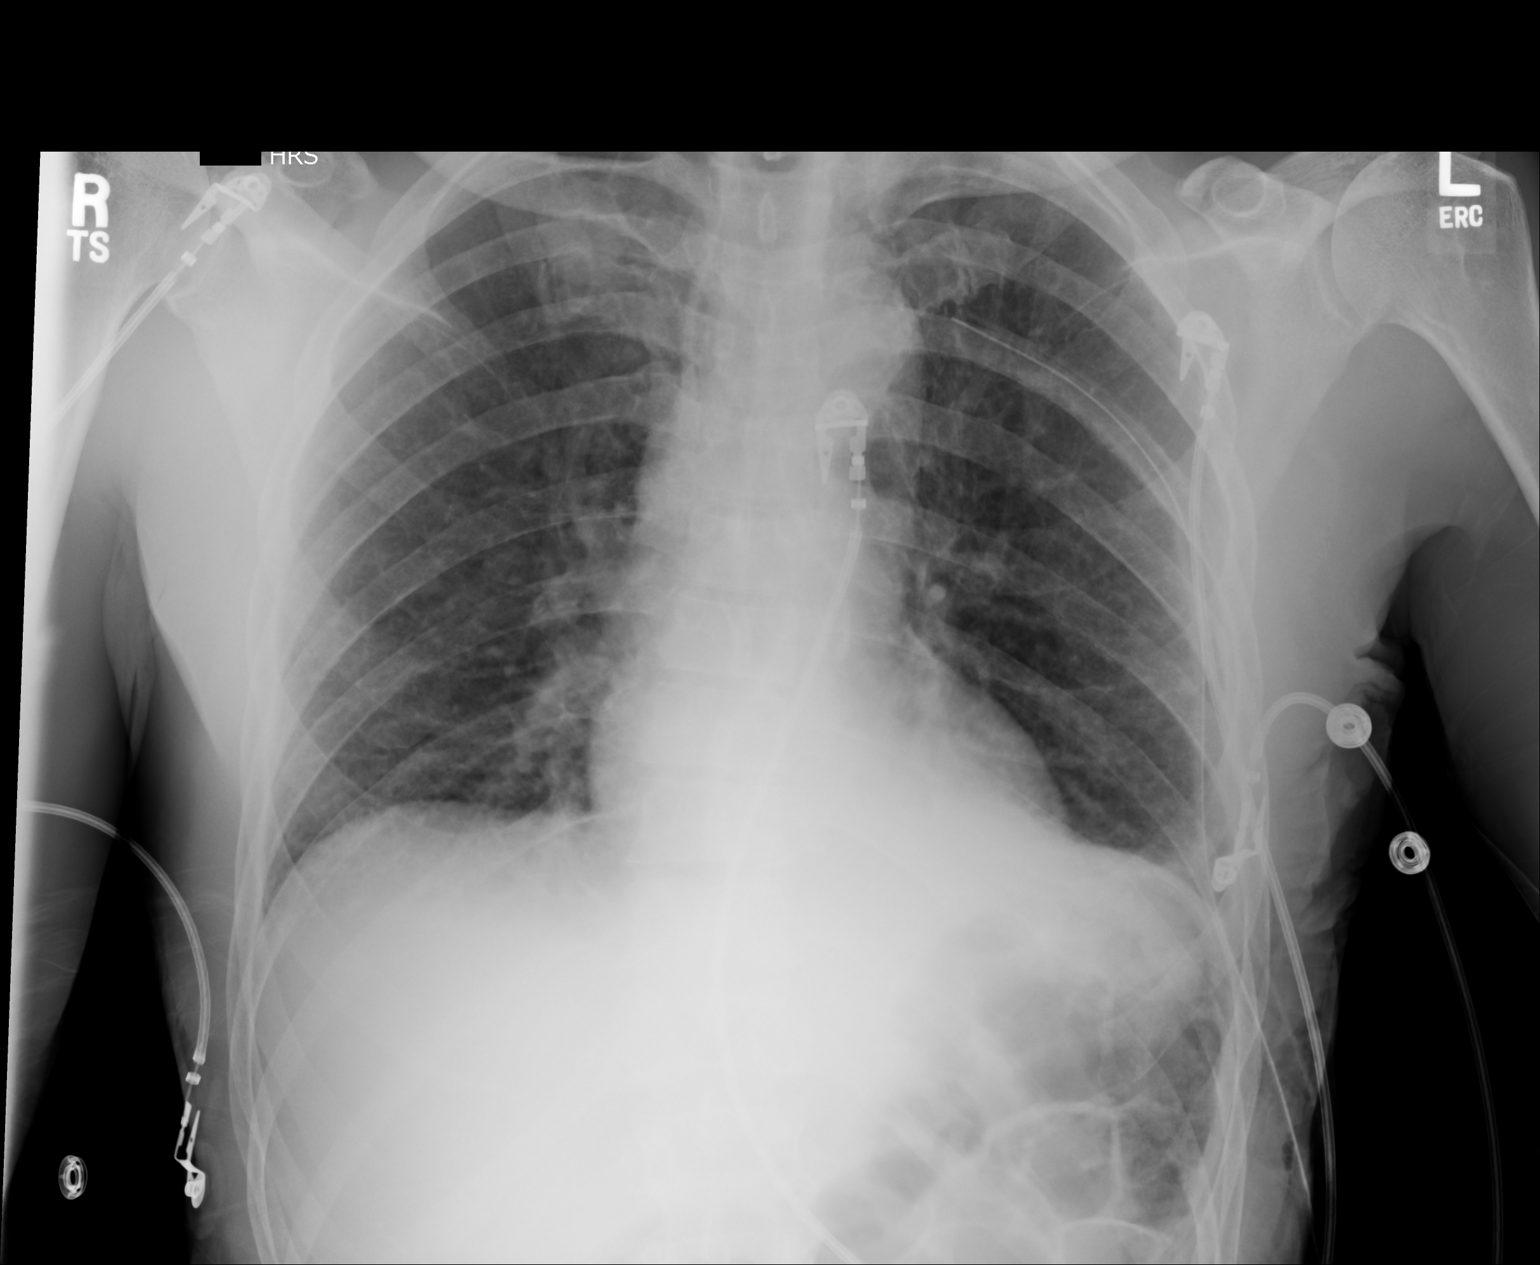

[1 of 1 positions shown; findings below may reference images not displayed]

FINDINGS: The heart and mediastinum are stable. No definite pneumothorax seen.
Left-sided chest tube remains. Bilateral interstitial opacities are similar
to prior. Right upper lobe large nodule is better demonstrate on today's
study. There is a small amount of subcutaneous air along the left
supraclavicular fossa, decreased from prior.
IMPRESSION: 1. No definite pneumothorax, with left-sided chest tube in place.
2. Interstitial opacities are similar to prior, possibly representing
interstitial pulmonary.

[REDACTED]

## 2013-11-05 IMAGING — CR DG CHEST 2V
1 series · 2 of 2 positions shown · non-contrast
Comparison: none

REASON FOR EXAM: assess for pleural effusion
COMMENTS:

[Series 8: x chest ap · 0.14mm/px · 2 of 2 slices shown]
[im 1/2]
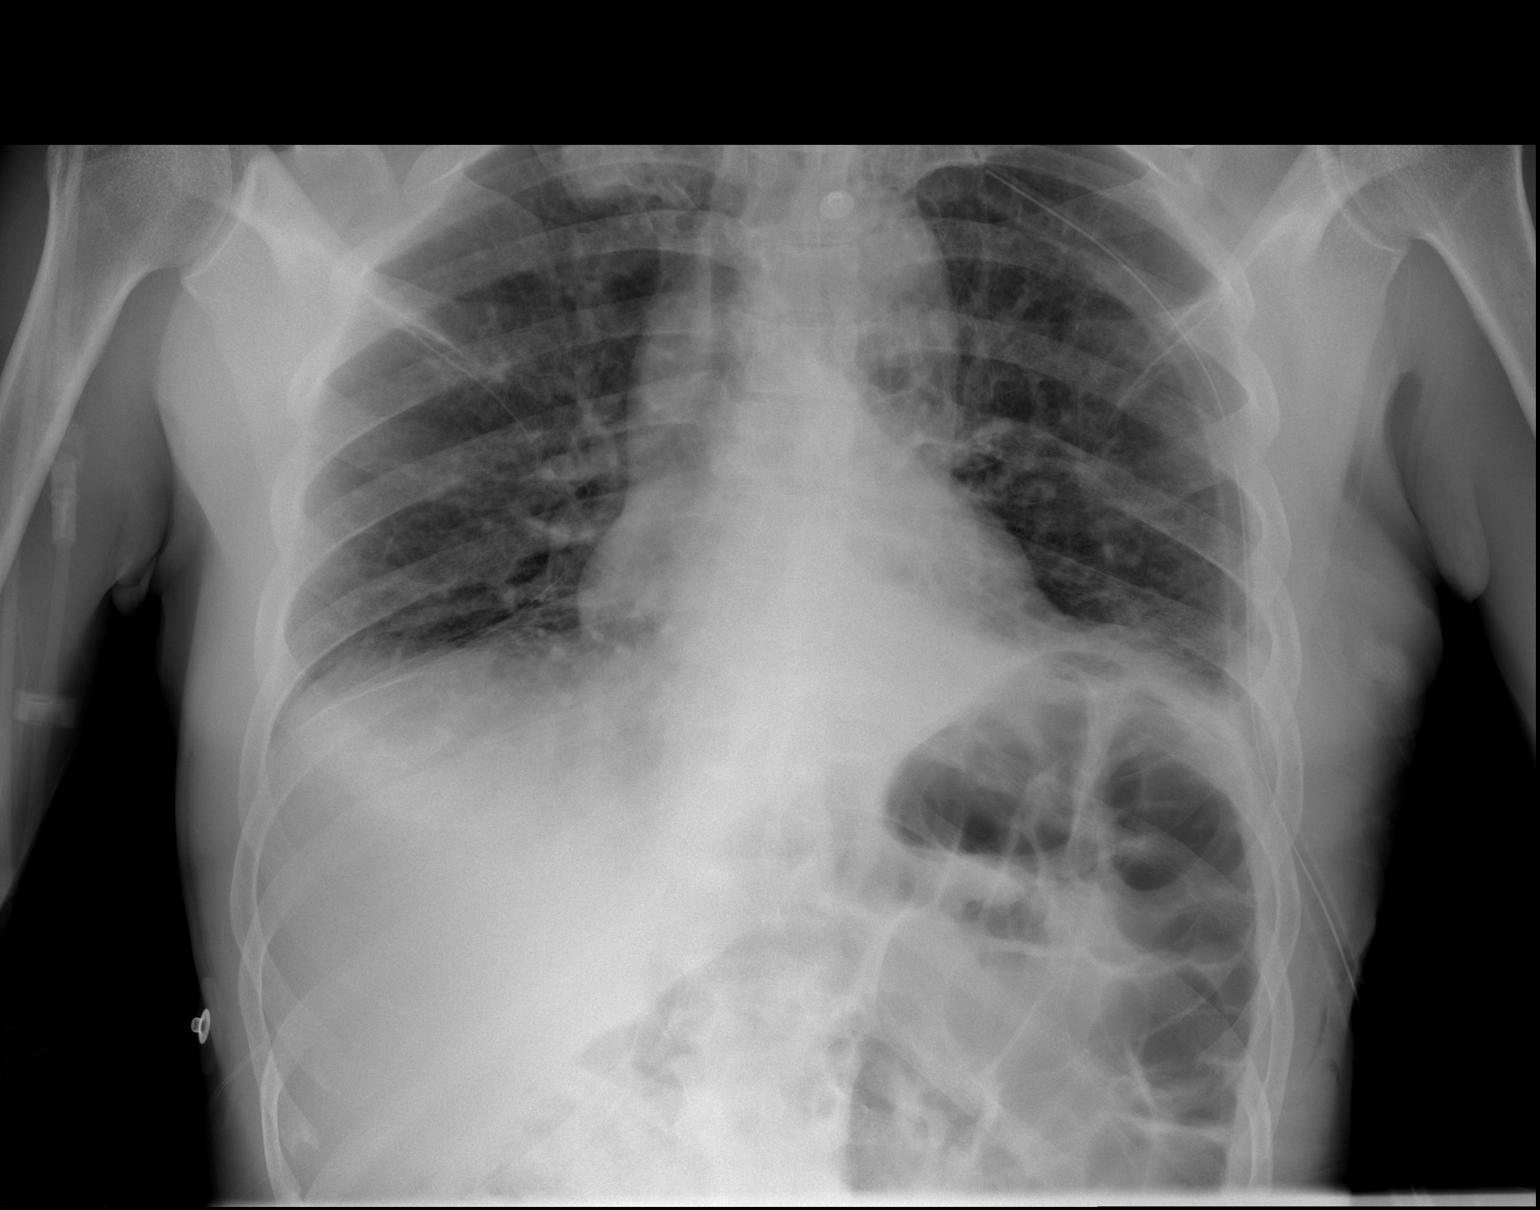
[im 2/2]
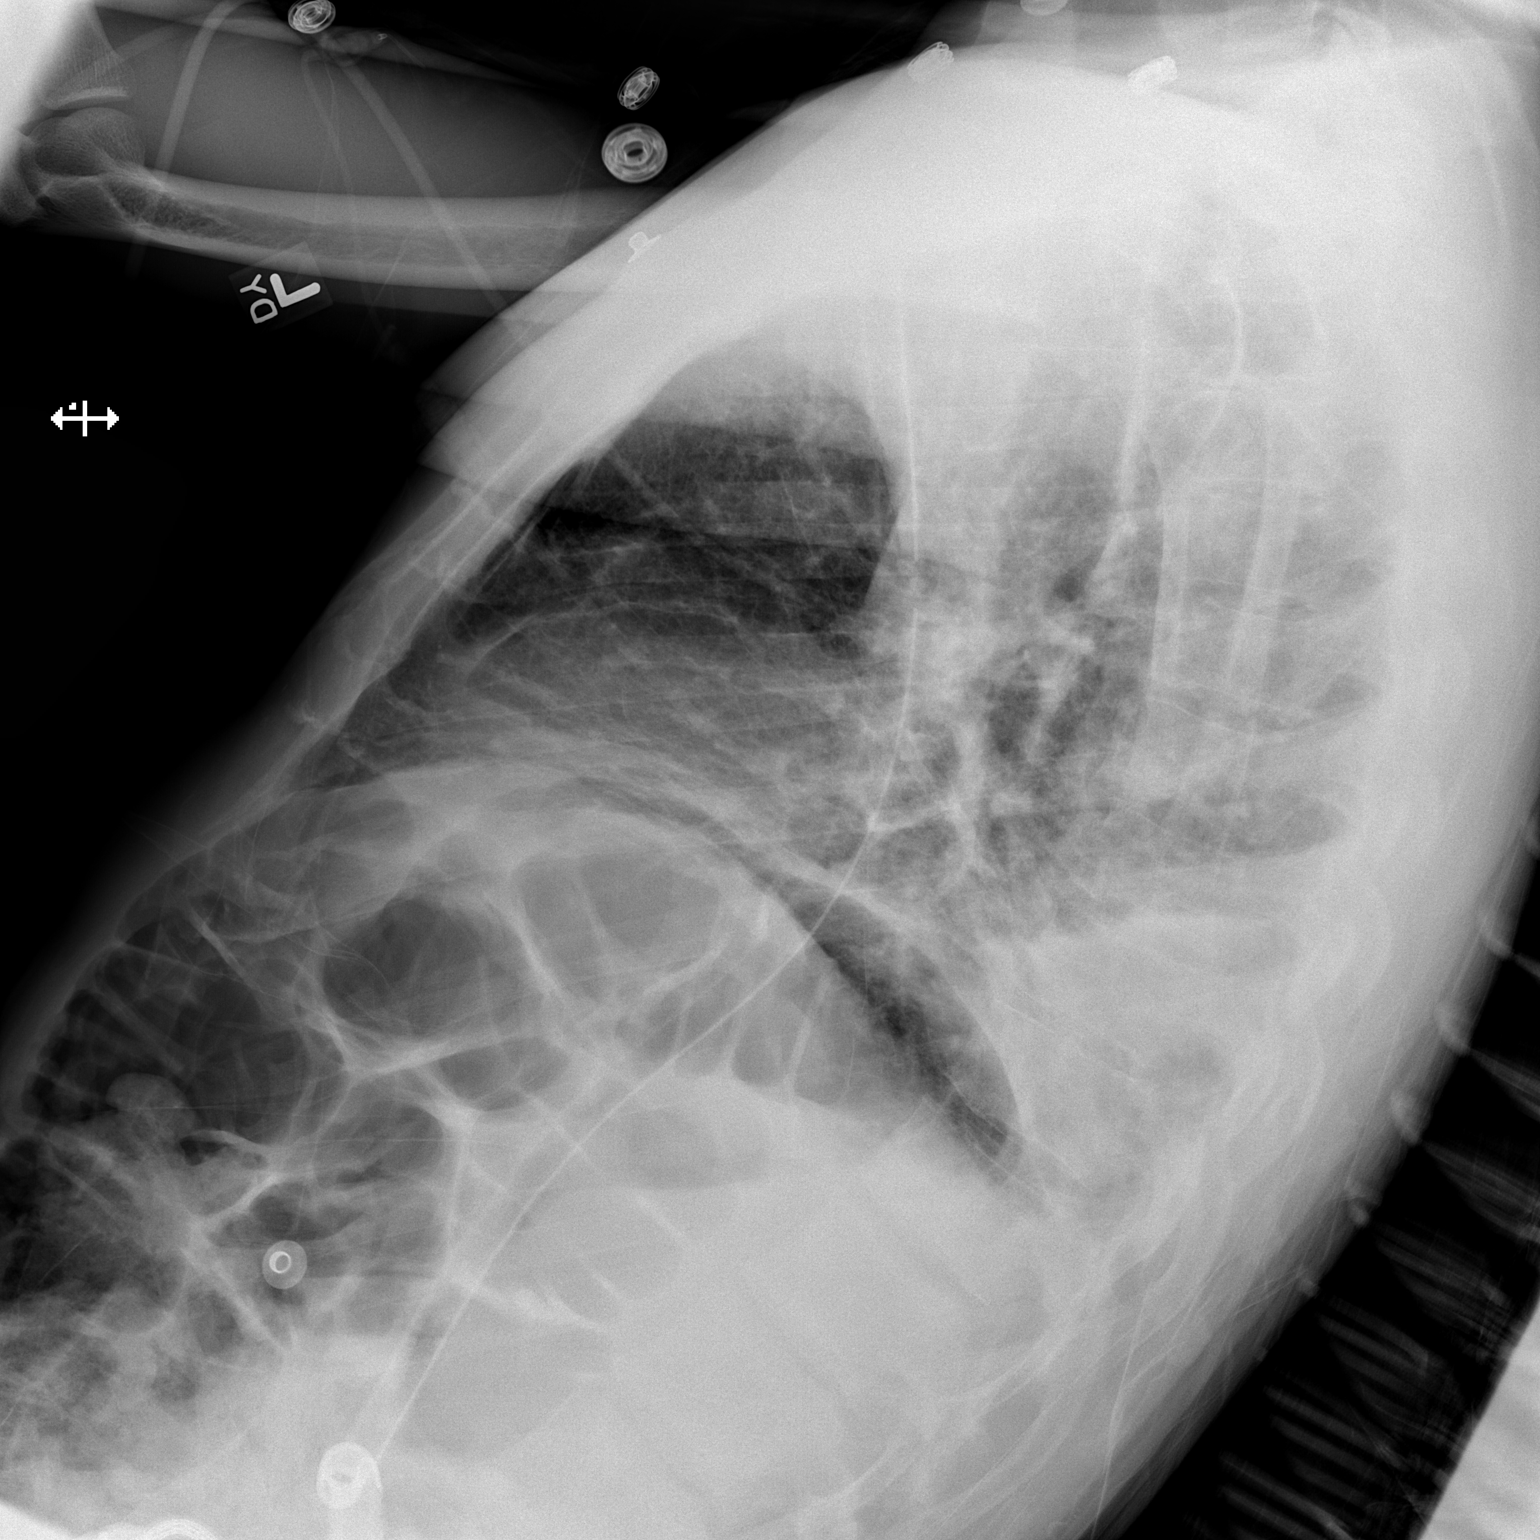

[2 of 2 positions shown; findings below may reference images not displayed]

PROCEDURE:     DXR - DXR CHEST PA (OR AP) AND LATERAL  - April 06, 2012  [DATE]

RESULT:     Comparison is made to the study April 05, 2012.

The lungs remain mildly hypoinflated. A chest tube remains on the left with
the tip between the third and fourth ribs posteriorly. A definite
pneumothorax is not appreciated today. There is pleural fluid layering
posteriorly on the left. No more than minimal fluid on the right is
suspected. There remains a soft tissue density in the right pulmonary apex.
The cardiac silhouette is normal in size. The retrocardiac region on the
left is somewhat less dense today than on the earlier study. The pulmonary
vascularity is not engorged. There is a moderate amount of gas within bowel
in the upper abdomen which is not new.
IMPRESSION: 1. There is persistent pleural fluid layering posteriorly on the left. The
left lung appears nearly totally if not totally reinflated.
2. Atelectasis in the retrocardiac region has improved somewhat but
significant abnormality remains.
3. There is persistent abnormal density in the right pulmonary apex. Both
lungs remain hypoinflated.

[REDACTED]

## 2014-07-29 NOTE — Consult Note (Signed)
PATIENT NAME:  Brett Francis, Brett Francis MR#:  [id number] DATE OF BIRTH:  [birth year]  DATE OF CONSULTATION:  03/26/2012  REFERRING PHYSICIAN:  Ida Roguehristopher Lundquist, MD CONSULTING PHYSICIAN:  Sheppard Plumberimothy E. Stalin Gruenberg, MD  REASON FOR CONSULTATION: Recurrent pneumothorax with persistent air leak.   I have personally seen and examined Brett Francis. I have independently reviewed his films and his chart. I have discussed his care with Dr. Juliann PulseLundquist and Dr. Excell Seltzerooper.   HISTORY OF PRESENT ILLNESS: Brett Francis is a very pleasant 59 year old African American gentleman with a long-standing smoking history. He was admitted to the hospital after a several day history of cough and shortness of breath. He states that he had some pain associated with it, but upon arrival here in the Emergency Room he was noted to have a tension pneumothorax on the left. A chest tube was inserted with prompt reexpansion of the lung. He developed acute reexpansion pulmonary edema which has subsequently cleared. Currently he does have a small air leak, even after interrogation of the system.   Of note is that Brett Francis was having some shortness of breath and cough just prior to developing acute deterioration. The family thinks that this may have happened several days prior to him coming into the hospital.   Of note is that he had a spontaneous pneumothorax on the right side approximately 15 to 20 years ago which was managed with a chest tube. After several days, his tube was removed and he was discharged.   CURRENT MEDICATIONS: None.  DRUG ALLERGIES: No known drug allergies.   SOCIAL HISTORY: He is married. He smokes a pack of cigarettes a day and has done so since his early teenage years. He does not drink alcohol. 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 He is married.   FAMILY HISTORY: There is no family history of lung cancer or COPD or emphysema.   REVIEW OF SYSTEMS: As per history of present illness and all  other review of systems were asked and were negative.   PHYSICAL EXAMINATION: Exam revealed a pleasant, well-developed, well-nourished gentleman in no distress. He was able to speak in complete sentences without dyspnea. Interrogation of his chest tube revealed the chest to have a small air leak. This was with cough or forceful Valsalva maneuvering. His chest tube site was secured. The lungs were distant, but equal bilaterally. His heart was regular. His abdomen was soft, nontender and nondistended. There was no palpable adenopathy in his neck or supraclavicular space. He had no carotid bruits. His extremities were without clubbing, cyanosis or edema. He had good pulses throughout.   ASSESSMENT AND PLAN: I have independently reviewed the patient's chest x-rays. This does reveal a tension pneumothorax upon admission with prompt reexpansion after chest tube insertion. He has developed reexpansion pulmonary edema, which has subsequently cleared for the most part. I would recommend that we obtain a chest CT since this is his second occurrence. I explained to him that once we have that information we could make further recommendations if surgery were necessary. There is no family history of an alpha-I antitrypsin deficiency and certainly his chest x-rays do not suggest that. Therefore, I think we should go with a chest CT before any further recommendations are made.   Thank you very much for allowing me to participate in his care.  ____________________________ Sheppard Plumberimothy E. Thelma Bargeaks, MD teo:sb D: 03/26/2012 13:34:17 ET T: 03/26/2012 14:48:48 ET JOB#: [id number]  cc: Marcial Pacasimothy E. Thelma Bargeaks, MD, <Dictator>  Jasmine December MD ELECTRONICALLY SIGNED 03/27/2012 6:45

## 2014-07-29 NOTE — Consult Note (Signed)
PATIENT NAME:  Brett Francis, Brett Francis MR#:  [id number] DATE OF BIRTH:  1955-12-15  DATE OF CONSULTATION:  04/05/2012  REFERRING PHYSICIAN:  Dr. Thelma Bargeaks.   CONSULTING PHYSICIAN:  Tishanna Dunford R. Sherrlyn HockPandit, MD  REASON FOR CONSULTATION: Newly diagnosed non-small cell lung cancer.   HISTORY OF PRESENT ILLNESS: A 59 year old gentleman with history of smoking, remote history of spontaneous pneumothorax on the right side many years ago, who has been admitted to the hospital on March 23, 2012, after he presented with a 2-day history of shortness of breath. A chest x-ray showed large left pneumothorax. The patient currently has chest tube drainage and followed by Dr. Thelma Bargeaks. He had CT scan of the chest on March 26, 2012, which showed chest tube, tiny anterior pneumothorax remaining, soft tissue masses in both upper lobes of the lungs with largest being on the right side, dense consolidation much of the left lower lobe.  The patient then had EBUS procedure done and biopsied. Preliminary consistent with non-small cell lung cancer. Oncology has been consulted for this.   Clinically, the patient states that he has been active and ambulatory up until current sickness/admission to hospital. He has been working regularly as a Naval architecttruck driver. Denies any known history of malignancy in the past. Appetite and weight have been steady. Denies any new bone pains. Denies any new headaches, visual disturbances, imbalance or falls.   PAST MEDICAL HISTORY/SURGICAL HISTORY: As in history of present illness above. Otherwise, denies any diabetes, hypertension, heart disease, kidney problems in the past.   FAMILY HISTORY: Noncontributory, denies malignancy.   SOCIAL HISTORY: Chronic smoker 1 pack per day for greater than 30 years. Denies alcohol or recreational drug usage. Physically active and ambulatory. Works as a Naval architecttruck driver.   Current medications in hospital:  1.  Nystatin 100,000 units/mL 5 mL every six hours.  2.  Protonix 40 mg IV  q.a.m.  3.  Heparin 5000 units subcutaneous q.12 hours.  4.  Dilaudid PCA for pain control.  5.  Norco 5/325 mg p.o. every four hours p.r.n. for pain.  6.  Morphine 2 to 4 mg IV every two hours p.r.n. for pain.  7.  Albuterol SVN 3 mL q.6h. p.r.n.  8.  Oxygen p.r.n.   ALLERGIES: No known drug allergies.   REVIEW OF SYSTEMS:  CONSTITUTIONAL: As in history of present illness. No fever or chills. No night sweats.  HEENT: Denies headaches, dizziness, epistaxis, ear or jaw pain.  CARDIAC: Denies any chest pain, palpitations, orthopnea, or paroxysmal nocturnal dyspnea.  LUNGS: As in history of present illness. Has mild cough. Mild dyspnea on exertion, has left-side chest tube. No hemoptysis.  GASTROINTESTINAL: No nausea, vomiting, or diarrhea. Denies bright red blood in stools or melena.  GENITOURINARY: No dysuria or hematuria.  SKIN: No new rashes or pruritus.  HEMATOLOGIC: Denies obvious bleeding symptoms.  MUSCULOSKELETAL: No new bone pains.  EXTREMITIES: No new swelling or pain.  NEUROLOGIC: No new focal weakness, seizures, or loss of consciousness. No new paresthesias in extremities.  ENDOCRINE: No polyuria or polydipsia. Appetite is steady.   Laboratory, diagnostic, and radiological data: April 02, 2012, WBC 15,100, neutrophils, 12,200, hemoglobin 17.8, platelets 366.  Creatinine 1.1, calcium 9.2.   IMPRESSION AND RECOMMENDATIONS: A 59 year old gentleman with history of chronic smoking, admitted with pneumothorax. CT scan also showed bilateral upper lung masses and dense left lower lobe consolidation. The patient is status post endobrachial ultrasound and biopsy procedure of right paratracheal lymph node on March 30, 2012,  which did confirm  poorly differentiated non-small cell carcinoma. The patient explained that he has advanced stage non-small cell lung cancer, especially if contralateral lung mass is also malignant. Once chest tube comes out, he will need further staging workup  with PET scan. He does not have any headaches or other neurological symptoms at this time. Once staging is more clear with PET scanning, we will be able to make a treatment plan. Per discussion with Dr. Thelma Barge, chest tube will likely come out in the next 1 to 2 days, and he will be discharged home over the weekend. We will therefore request followup appointment as an outpatient at the [facility] on April 09, 2012, and then get PET scan done. The patient also states that he will discuss with his wife and likely could pursue a second opinion at [facility]. He otherwise does not have any acute pain issues at this time. The patient is agreeable to above plan.   Thank you for the referral. Please feel free to contact me for any questions.   ____________________________ Maren Reamer Sherrlyn Hock, MD srp:th D: 04/05/2012 18:47:02 ET T: 04/05/2012 19:22:03 ET JOB#: [id number]  cc: Angellica Maddison R. Sherrlyn Hock, MD, <Dictator> Wille Celeste MD ELECTRONICALLY SIGNED 04/05/2012 22:54

## 2014-07-29 NOTE — H&P (Signed)
PATIENT NAME:  Brett Francis, Brett Francis MR#:  [id number] DATE OF BIRTH:  27-May-1955  DATE OF ADMISSION:  03/23/2012  ATTENDING PHYSICIAN: Ida Roguehristopher Belkys Henault, MD  REASON FOR ADMISSION: Shortness of breath x 2 days, large left pneumothorax on chest x-ray.   HISTORY OF PRESENT ILLNESS: Mr. Brett Francis is a pleasant 59 year old male who smokes x 40 years with a history of spontaneous right pneumothorax approximately 15 years ago who presents with two days of shortness of breath. He says it began acutely and was getting worse. He had an x-ray which showed a large left pneumothorax; otherwise, no fevers, chills, night sweats, chest pain, abdominal pain, nausea, vomiting, diarrhea, constipation, hematuria, denies dysuria, any focal, weakness or headaches.   PAST MEDICAL HISTORY: Spontaneous pneumothorax approximately 15 years ago on his right side.  HOME MEDICATIONS: Alka-Seltzer 2 tabs p.o. every 6 hours p.r.n. cold and congestion.   ALLERGIES: No known drug allergies.   SOCIAL HISTORY: Approximately 40 years 1 pack a day smoking. Denies alcohol. Denies illegal drugs. Is here with his wife.   REVIEW OF SYSTEMS: Otherwise 12 point review of systems was obtained, pertinent positives and negatives as follows.  PHYSICAL EXAMINATION:   VITAL SIGNS: Temperature 97.3, pulse 72, systolic blood pressure 127/78, respirations 19, 97% on nonrebreather, does appear short of breath.   GENERAL: No acute distress, alert and oriented x3.   HEAD: Normocephalic, atraumatic.   EYES: No scleral icterus. No conjunctivitis.   FACE: Has a nonrebreather face mask. No obvious facial trauma. Normal external ears. Normal external nose.   NECK: Thin, no obvious masses. Supple.   LUNGS: Decreased breath sounds on the left, clear to auscultation on the right.   HEART: Regular rate and rhythm. No murmurs, rubs or gallops.   ABDOMEN: Soft, nontender nondistended.   EXTREMITIES: Moves all extremities well, 10 out of 10.    NEURO: Cranial nerves II through XII grossly intact. Sensation intact in all four extremities.   LABS: No labs drawn.   IMAGING: Large left-sided pneumothorax with some component of mediastinal shift.   Post chest tube x-ray shows resolution of pneumothorax.   ASSESSMENT AND PLAN: Mr. Brett Francis is a pleasant 59 year old male with a history of a spontaneous pneumothorax who presents with spontaneous pneumothorax with a tension component. Chest tube is in place. There was a burst of air upon entering the pleural cavity indicating a definite tension component. Has a persistent air leak. At this time, we will obtain an x-ray in the morning and attempt for this to close. May decrease suction in the a.m. to allow this to close as well. We will consult cardiothoracic if air leak is not improved in a few days. I have explained this story to Mr. Brett Francis who agrees with this plan.  ____________________________ Si Raiderhristopher A. Guillermo Difrancesco, MD cal:slb D: 03/23/2012 22:55:24 ET     T: 03/24/2012 08:08:49 ET         JOB#: [id number] cc: Cristal Deerhristopher A. Tyrica Afzal, MD, <Dictator> Jarvis NewcomerHRISTOPHER A Quantay Zaremba MD ELECTRONICALLY SIGNED 03/24/2012 18:58

## 2014-07-29 NOTE — Op Note (Signed)
PATIENT NAME:  Brett Francis, Brett Francis MR#:  [id number] DATE OF BIRTH:  04-11-1956  DATE OF PROCEDURE:  04/02/2012  SURGEON: Marcial Pacasimothy E. Thelma Bargeaks, MD  ASSISTANT: Si Raiderhristopher A. Lundquist, MD  PREOPERATIVE DIAGNOSIS: Persistent air leak following spontaneous pneumothorax.   POSTOPERATIVE DIAGNOSIS: Persistent air leak following spontaneous pneumothorax.   OPERATION PERFORMED:  1. Preoperative bronchoscopy to assess endobronchial anatomy.  2. Left thoracoscopy with talc pleurodesis.  3. Debridement of prior chest tube site with closure.   INDICATIONS FOR PROCEDURE: The patient is a 59 year old gentleman who was recently admitted to the hospital with a spontaneous pneumothorax. Extensive evaluation preoperatively revealed bilateral upper lobe lesions consistent with malignancy and a bronchoscopy showing non-small cell carcinoma of the lung and a right paratracheal lymph node. He has had a continued air leak from his left chest and he was offered the above-named procedure for definitive diagnosis. I had a long discussion with the patient regarding the need for possible lung resection for either his primary tumor in the lung or his apical blebs or presumed blebs. He, however, would not consent to any lung resection whatsoever and would not consent to even a biopsy of the tumor. Therefore, we elected to only perform the talc pleurodesis at the patient's request.  DESCRIPTION OF PROCEDURE: The patient was brought to the operating suite and placed in the supine position. General endotracheal anesthesia was given. Preoperative bronchoscopy was carried out. There was no evidence of endobronchial tumor. A bronchial blocker was positioned in the left mainstem bronchus and inflated. The patient was then turned for a left thoracoscopy. All pressure points were carefully padded. The patient was prepped and draped in the usual sterile fashion after the chest tube was removed and with the left lung deflated. The chest was  explored through 2 thoracoscopy ports, one inferiorly and one posterior to our prior chest tube site. We could see that upon entering the chest, there was approximately 20 mL of some cloudy fluid which was suctioned and sent for cytology and culture. The lung itself showed evidence of extensive subpleural emphysematous changes. There were some areas at the very apex along that were stuck and could not be removed. We did not see anything that looked like an iatrogenic injury to the lung from the chest tube insertion. We did not see the mass. Five grams of sterile talc was then insufflated under direct visualization coating the entire pleural space. A single chest tube was inserted through our most inferior port and brought out through a separate stab wound. The wounds were all then closed in multiple layers using running absorbable sutures. The lung was inflated.   We then turned our attention to the most anterior chest tube site which was aggressively debrided back to healthy tissue. We used a knife to debride skin, subcutaneous tissue and muscle.  The deeper tissues were closed with interrupted Vicryl. The wound itself was packed with iodoform gauze. A sterile dressing was applied.   The patient was then rolled in a supine position where he was extubated and taken to the recovery room in stable condition.     ____________________________ Sheppard Plumberimothy E. Thelma Bargeaks, MD teo:es D: 04/02/2012 15:41:02 ET T: 04/03/2012 08:49:21 ET JOB#: [id number]  cc: Marcial Pacasimothy E. Thelma Bargeaks, MD, <Dictator> Jasmine DecemberIMOTHY E Mate Alegria MD ELECTRONICALLY SIGNED 04/04/2012 15:36

## 2014-07-29 NOTE — Op Note (Signed)
PATIENT NAME:  Brett Francis, Brett Francis MR#:  [id number] DATE OF BIRTH:  1955/09/13  DATE OF PROCEDURE:  03/23/2012  PREOPERATIVE DIAGNOSIS: Left-sided spontaneous pneumothorax.   POSTOPERATIVE DIAGNOSIS: Left-sided spontaneous pneumothorax with a tension component.   PROCEDURE PERFORMED: Left tube thoracostomy.   SURGEON: Skilynn Durney A. Idalee Foxworthy, MD   ANESTHESIA: 100 mcg of fentanyl, 2 mg of Versed, and 30 mL of 1% lidocaine with epinephrine.   COMPLICATIONS: None.   SPECIMENS: None.   IMPLANTS: A 28-French  chest tube.   INDICATIONS FOR PROCEDURE: [name] is a 59 year old male who uses tobacco who presented with a two-day history of shortness of breath. He was found on x-ray to have a large pneumothorax with some mediastinal shift. We, thus, thought he was in need of a left tube thoracostomy.   DETAILS OF PROCEDURE: Informed consent was obtained. His left chest was then prepped and draped in standard surgical fashion. A timeout was then performed, correctly identifying the patient name, operative site and procedure to be performed. The patient received IV narcotic pain medicine and anesthesia and had his left rib at the anterior axillary line; and  the left nipple line was anesthetized with local lidocaine in the subcutaneous tissue as well as the periosteum of the rib and the rib space.  The 15 blade knife was then used to incise the skin over the rib. A Kelly clamp was used to dissect the subcutaneous tissues and enter the pleural space above the rib. There was a rush of air outward, and I placed a gloved finger into the thoracostomy site; and I actually felt expanded lung indicating that there was some component of tension. I then inserted a 28-French chest tube apically to approximately 10 cm. It was then sutured in place with a #1 silk U stitch as my thoracostomy site was a bit larger. I did place a simple interrupted at the lateralmost aspect to it to close the site around the tube. I placed a  sterile dressing over the tube and hooked it up to suction to a Pleur-evac, and there was a persistent air leak which did not change with respiration. A postprocedure chest x-ray showed expansion of the lung and tube in adequate position. There were no major complications. Needle, sponge, and instrument counts were correct at the end of the procedure. I was present and scrubbed for the entirety of the procedure.   ____________________________ Si Raiderhristopher A. Zoya Sprecher, MD cal:cbb D: 03/23/2012 22:50:02 ET T: 03/24/2012 11:20:31 ET JOB#: [id number]  cc: Cristal Deerhristopher A. Terre Zabriskie, MD, <Dictator> Jarvis NewcomerHRISTOPHER A Ravneet Spilker MD ELECTRONICALLY SIGNED 03/24/2012 18:58

## 2014-08-01 NOTE — Discharge Summary (Signed)
PATIENT NAME:  Brett Francis, [name] MR#:  [id number] DATE OF BIRTH:  02/27/1956  DATE OF ADMISSION:  03/23/2012 DATE OF DISCHARGE:  04/07/2012   ATTENDING PHYSICIAN:  Cristal Deerhristopher A. Zoie Sarin, MD  DISCHARGE DIAGNOSES:   1.  Spontaneous left pneumothorax, status post left video-assisted thoracoscopic surgery and talc pleurodesis on 04/02/2012.  2. 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  3.  Emphysema/chronic obstructive pulmonary disease.  4.  History of a spontaneous pneumothorax approximately 20 years ago.   DISCHARGE MEDICATIONS:  Are as follows:  1.  Norco 5/325 one tab p.o. q. 4 hours p.r.n. pain.  2.  Alka-Seltzer extra strength p.r.n. reflux disease.   PROCEDURES PERFORMED:   1.  Left chest tube thoracostomy on 03/23/2012.  2.  Endobronchial ultrasound and biopsy of right paratracheal lymph node by Dr. Belia HemanKasa on 03/30/2012.  3.  Left VATS talc pleurodesis on 04/02/2012.   INDICATION FOR ADMISSION:  The patient is a pleasant 59 year old male who presented with a spontaneous left-sided pneumothorax. He had a history of a right-sided pneumothorax.  He was admitted for tube thoracostomy and evacuation of pneumothorax on 03/23/2012.   DETAILS OF HOSPITALIZATION: Are as follows: The patient is a pleasant 59 year old who has had a chest tube in and he was noted to have a persistent air leak and thus, I consulted Dr. Thelma Bargeaks. He had a CT on December 16th and was noted to have a right-sided lung lesion, a left-sided lesion which were concerning for carcinoma. Dr. Belia HemanKasa with pulmonology was consulted and he did an endobronchial ultrasound which biopsied a right paratracheal lymph node which later came back from pathology with poorly differentiated non-small cell cancer. Dr. Thelma Bargeaks was consulted at this time and offered the patient a biopsy of his left-sided lung nodule, at which time he refused. He  desired a pleurodesis so he could have a tube thoracostomy and on December 23rd he had a left VATS talc pleurodesis performed, after which his air leak did cease and we  gradually placed his chest tube to water seal and discontinued it and without re-collapsing lung. At the time of discharge, he was tolerating pain with p.o. narcotics. He was voiding and stooling without difficulty. His lung was expanding and he was breathing normal.    DISCHARGE INSTRUCTIONS:  Are as follows: The patient is to call or return to the ED if he has increased pain, nausea, vomiting, redness or drainage from incision or difficulty breathing. He is to follow up with Dr. Thelma Bargeaks in approximately 1 week for followup.    ____________________________ Si Raiderhristopher A. Kaylei Frink, MD cal:si D: 04/18/2012 15:24:00 ET T: 04/18/2012 18:13:37 ET JOB#: [id number]  cc: Cristal Deerhristopher A. Shaleka Brines, MD, <Dictator> Jarvis NewcomerHRISTOPHER A Lavana Huckeba MD ELECTRONICALLY SIGNED 04/19/2012 7:57

## 2014-08-02 NOTE — Consult Note (Signed)
patient had been briefly examined .  All lab daytime CT scan has been reviewedwill [facility] all [facility] and further planning of treatment would follow   Electronic Signatures: Genieve Ramaswamy, Gerome SamJanak K (MD)  (Signed on 20-Apr-15 17:43)  Authored  Last Updated: 20-Apr-15 17:43 by Laddie Aquashoksi, Petra Dumler K (MD)

## 2014-08-02 NOTE — Consult Note (Signed)
   Comments   Pt is s/p paracentesis with removal of 3.5L ascitic fluid and relief of symptoms. I spoke with pt and with Dr Cherlynn KaiserSainani. Plan is to [facility] and observe pt for reaccumulation over the week-end. If pt needs another paracentesis, it may be appropriate to explore the option of pleurx catheter for drainage.   Electronic Signatures: Tiwanda Threats, Harriett SineNancy (MD)  (Signed 01-May-15 16:28)  Authored: Palliative Care   Last Updated: 01-May-15 16:28 by Slyvia Lartigue, Harriett SineNancy (MD)

## 2014-08-02 NOTE — Consult Note (Signed)
PATIENT NAME:  Brett Francis, Brett Francis MR#:  [id number] DATE OF BIRTH:  1955/08/06  DATE OF CONSULTATION:  08/05/2013  CONSULTING PHYSICIAN:  Christena Deem, MD  PATIENT OF Dr. Jacques Navy   REASON FOR CONSULTATION: Possible small bowel obstruction.   HISTORY OF PRESENT ILLNESS: Mr. Telfair is a 59 year old, African-American male who was admitted on 08/05/2013 from his dialysis session with problems of possible DVTs in the lower extremities. He had been discharged from the hospital less than a week earlier after an admission on 07/21/2013 with shortness of breath and pneumonia. He has a history of metastatic lung cancer as well as recent sepsis from his pneumonia, and chronic constipation. I have been consulted in regards to possible small bowel obstruction. The patient states that he has been having nausea for over a week, as well as some intermittent emesis. He states he has been getting full fast when he tries to eat, and his appetite has been decreased for at least a week to 2 weeks. He has no history of peptic ulcer disease. He has never had an upper scope done. There is no heartburn or dysphagia. He states that more recently he has been having problems with constipation. However, he states he is passing flatus. He has never had a colonoscopy. He generally has a bowel movement about every 2 to 3 days. He denies any black bowel movements. He states he has seen small amounts of bright red on the toilet paper for at least a week, but could not be more specific. He has never had a colonoscopy in the past. He apparently did have a CT scan of the abdomen at [facility] a couple of weeks ago, that does not know the results.   He did pass a bowel movement yesterday.  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 He was seen in consultation by Vascular Surgery, and suggestion was made for an IVC filter.   PAST MEDICAL HISTORY: History of lung cancer,  COPD, acute renal failure, chronic back pain, pneumonia, chronic constipation. He has had VATS with pleurodesis in 2013. He apparently has had a pneumothorax in the past.   SOCIAL HISTORY: Previous smoker. No history of drug or alcohol abuse. He lives with his wife.   CURRENT OUTPATIENT MEDICATIONS:  Include albuterol CFC 90 mcg inhaler 4 times a day, he is supposed to be on some Ensure, fluticasone/salmeterol 250 mcg, 50 mcg inhalation twice a day, morphine extended-release 15 mg every 12 hours p.r.n., omeprazole 20 mg once a day, oxycodone 5 mg every 3 to 4 hours p.r.n., MiraLAX once a day, prochlorperazine 10 mg every 6 hours for nausea and vomiting p.r.n., tiotropium 18 mcg inhalation capsule once a day.   He previously was taking some Alka-Seltzer, although currently denies aspirin or NSAID use otherwise.   ALLERGIES:  He has no known drug allergies.   PHYSICAL EXAMINATION: VITAL SIGNS: Temperature is 97.7, pulse 129, respirations 19, blood pressure 120/77, pulse ox 94%.  GENERAL: He is a cachectic-appearing, 59 year old African American male in no acute distress.  HEENT: Normocephalic/atraumatic. Oropharynx:  No lesions.  NECK: Supple. No JVD.  HEART: Regular rate and rhythm.  LUNGS: Clear.  ABDOMEN:  Moderately distended. Bowel sounds are positive. He has some mild tympany to percussion. He is absolutely nontender to palpation or release.  RECTAL: Anorectal exam is deferred.  EXTREMITIES: No clubbing, cyanosis, or currently edema.   LABORATORIES INCLUDE THE FOLLOWING:  On admission to  the hospital, glucose 154, BUN 30, creatinine 1.69, sodium 141, potassium 3.5, chloride 103, bicarb 27, calcium 9.5. Hemogram on admission:  White count 14.1, H and H 10.7/33.3, platelet count 113. His INR was 1.4. He is currently on heparin.   Laboratories today showed a white count of 13.3, H and H 11.1/34.7, platelet count of 119, and his creatinine was 1.7. He had a portable chest film yesterday, this  showing a probable small right pleural effusion as well as lung densities completely obscuring the left cardiac border. No evidence of pneumothorax. Pleural and parenchymal densities in the left chest. He had an abdominal flat and erect film today, this showing air-filled, mildly dilated loops of small bowel in the mid and upper abdomen. Gas and stool present in the colon. Ileus versus early mild small bowel obstruction not excluded. Moderate left pleural effusion, left basilar fibrosis, underlying COPD. He had an ultrasound of the lower extremities, as noted above.   ASSESSMENT:  1.  Abdominal distention. In further discussion with the patient, this has been intermittently occurring for at least a week or two, and seems to be getting worse. He is currently passing flatus, and had a stool yesterday. My concern would be, of course, for obstructive lesions in the intestine, metastatic versus primary. Also concern for possibly other lesions which would increase fluid densities in the abdomen, with attendant distention such as peritoneal carcinomatosis with his metastatic disease.   RECOMMENDATIONS: 1.  Daily two-way abdominal film.  2. The patient states he did have a [facility]. I have requested nursing to get that film result.  3.  It is of note that patient is currently NO CODE, DNR, and no medical procedures. Will follow conservatively. If we are unable to obtain [facility], we will need to consider abdominal ultrasound versus CT scanning, as clinically needed.    ____________________________ Christena DeemMartin U. Skulskie, MD mus:mr D: 08/06/2013 19:52:42 ET T: 08/06/2013 20:11:26 ET JOB#: [id number]  cc: Christena DeemMartin U. Skulskie, MD, <Dictator> Christena DeemMARTIN U SKULSKIE MD ELECTRONICALLY SIGNED 08/08/2013 11:38

## 2014-08-02 NOTE — Consult Note (Signed)
Chief Complaint:  Subjective/Chief Complaint seen for possible sbo.  patient states he has been passing flatus but no bm.   no nausea or emesis, states he feels less distended today.   VITAL SIGNS/ANCILLARY NOTES: **Vital Signs.:   29-Apr-15 05:28  Vital Signs Type Routine  Temperature Temperature (F) 99.2  Celsius 37.3  Temperature Source oral  Pulse Pulse 126  Respirations Respirations 20  Systolic BP Systolic BP 518  Diastolic BP (mmHg) Diastolic BP (mmHg) 79  Mean BP 91  Pulse Ox % Pulse Ox % 95  Pulse Ox Activity Level  At rest  Oxygen Delivery 3L   Brief Assessment:  Cardiac Regular   Respiratory coarse aw noise bilat, poor breath sounds left lung.   Gastrointestinal details normal soft mild distension, no pain non-tender, bowel sounds positive.   Lab Results: Routine Chem:  29-Apr-15 07:07   Glucose, Serum  105  BUN  26  Creatinine (comp)  1.79  Sodium, Serum 141  Potassium, Serum 3.9  Chloride, Serum 103  CO2, Serum 26  Calcium (Total), Serum  10.2  Anion Gap 12  Osmolality (calc) 286  eGFR (African American)  48  eGFR (Non-African American)  41 (eGFR values <68m/min/1.73 m2 may be an indication of chronic kidney disease (CKD). Calculated eGFR is useful in patients with stable renal function. The eGFR calculation will not be reliable in acutely ill patients when serum creatinine is changing rapidly. It is not useful in  patients on dialysis. The eGFR calculation may not be applicable to patients at the low and high extremes of body sizes, pregnant women, and vegetarians.)  Result Comment LABS - This specimen was collected through an   - indwelling catheter or arterial line.  - A minimum of 564m of blood was wasted prior    - to collecting the sample.  Interpret  - results with caution.  Result(s) reported on 07 Aug 2013 at 07:46AM.  Routine Coag:  29-Apr-15 06:23   Activated PTT (APTT)  61.9 (A HCT value >55% may artifactually increase the APTT. In  one study, the increase was an average of 19%. Reference: "Effect on Routine and Special Coagulation Testing Values of Citrate Anticoagulant Adjustment in Patients with High HCT Values." American Journal of Clinical Pathology 2006;126:400-405.)    14:13   Activated PTT (APTT)  74.9 (A HCT value >55% may artifactually increase the APTT. In one study, the increase was an average of 19%. Reference: "Effect on Routine and Special Coagulation Testing Values of Citrate Anticoagulant Adjustment in Patients with High HCT Values." American Journal of Clinical Pathology 2006;126:400-405.)  Routine Hem:  29-Apr-15 07:07   Platelet Count (CBC)  137  Hemoglobin (CBC)  10.4 (Result(s) reported on 07 Aug 2013 at 07:46AM.)   Radiology Results: XRay:    29-Apr-15 13:27, Abdomen Flat and Erect  Abdomen Flat and Erect   REASON FOR EXAM:    abd distension. compare to previous eval for sbo  COMMENTS:       PROCEDURE: DXR - DXR ABDOMEN 2 V FLAT AND ERECT  - Aug 07 2013  1:27PM     CLINICAL DATA:  Abdominal distention.    EXAM:  ABDOMEN - 2 VIEW    COMPARISON:  None.    FINDINGS:  Soft tissue structures are unremarkable. Interim slight resolution  of small bowel distention. Air is noted in the rectosigmoid. No free  air. These findings suggest partial resolution of partial  small-bowel obstruction or ileus. Continued follow-up abdominal  series suggested. Inferior  vena cava filter noted with tip at the  superior endplate of L2. No acute bony abnormality.    Dense left lower lobe infiltrate/fibrosis and left pleural  effusion/scarring remain.     IMPRESSION:  1. Interim slight improvement in bowel distention. This suggests  interim partial improvement ileus/partial small bowel obstruction.  2. Interim placement of inferior vena caval filter. It's tip is at  the level of the superior endplate of L2.  3. Persistent left lower lobe atelectasis and/or fibrosis with  left-sided pleural effusion  or scarring.  Electronically Signed    By: Marcello Moores  Register    On: 08/07/2013 15:30         Verified By: Osa Craver, M.D., MD   Assessment/Plan:  Assessment/Plan:  Assessment 1) mild/partial sbo- improved some today.  no abdominal pain. My concern is that there is ascites present as well contributing to the abdominal distension.   Plan 1) recommend abd [place] for ascites check.  Patient is non-tender, and not sob, may not need tap other thatn diagnostic for cytology, albumen, cell count and diff.  Discussed with Dr Bridgette Habermann.   Electronic Signatures: Loistine Simas (MD)  (Signed 29-Apr-15 15:43)  Authored: Chief Complaint, VITAL SIGNS/ANCILLARY NOTES, Brief Assessment, Lab Results, Radiology Results, Assessment/Plan   Last Updated: 29-Apr-15 15:43 by Loistine Simas (MD)

## 2014-08-02 NOTE — Consult Note (Signed)
History of Present Illness:  Reason for Consult 1.Non-small cell carcinoma of lung diagnosed in December of 2013 He initially was stage IIIa (patient also had left lingular mass which was not biopsied.)  Had a right upper lobe mass and hilar adenopathy Patient was treated we cis-platinum, VP-16, radiation therapy Subsequence staging revealed patient has progressing lingular mass. Patient was treated at [facility].   HPI   59 year old African American gentleman was diagnosed with stage III carcinoma of lung at [facility] treated with cis-platinum VP-16 and radiation therapy subsequent restaging shows growth of tumor patient was being consider for biopsy for molecular markers.  I do not have any other information that patient had a second line chemotherapy or not.  Patient had multiple admission in the hospital with dehydration and renal failure or and recent admission with bilateral deep vein thrombosis  Has abdominal distention.  Patient is on oxygen.  Performance status is declining.  PFSH:  Comments mother had breast cancer   Social History negative alcohol   Comments patient is a history of smoking from January 1973 to December of 2013 than patient quit smoking. 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  Abdominal distention.  NURSING NOTES: **Vital Signs.:   29-Apr-15 05:28   Vital Signs Type: Routine   Temperature Temperature (F): 99.2    Celsius: 37.3   Temperature Source: oral   Pulse Pulse: 126   Respirations Respirations: 20   Systolic BP Systolic BP: 951   Diastolic BP (mmHg) Diastolic BP (mmHg): 79   Mean BP: 91   Pulse Ox % Pulse Ox %: 95   Pulse Ox Activity Level: At rest   Oxygen Delivery: 3L   Physical Exam:  General patient is cachectic ,lying in the bed with oxygen.  Not in any acute distress   HEENT: normal   Lungs: rales  crepitations  rhonchi   Cardiac: irregular rate, rhythm   Skin: intact   Extremities: edema   Neuro: AAOx3  cranial nerves intact  disoriented   Psych: mood agitated   Physical Exam abdomen: Distention.  Tympanic sounds.  Bowel sounds are present.  Liver and spleen not palpable     s/p pneumothorax:    lung CA:    s/p vats with pleurodesis for ptx:    CT:    No Known Allergies:   Laboratory Results:  Routine Chem:  29-Apr-15 07:07   Glucose, Serum  105  BUN  26  Creatinine (comp)  1.79  Sodium, Serum 141  Potassium, Serum 3.9  Chloride, Serum 103  CO2, Serum 26  Calcium (Total), Serum  10.2  Anion Gap 12  Osmolality (calc) 286  eGFR (African American)  48  eGFR (Non-African American)  41 (eGFR values <59m/min/1.73 m2 may be an indication of chronic kidney disease (CKD). Calculated eGFR is useful in patients with stable renal function.  The eGFR calculation will not be reliable in acutely ill patients when serum creatinine is changing rapidly. It is not useful in  patients on dialysis. The eGFR calculation may not be applicable to patients at the low and high extremes of body sizes, pregnant women, and vegetarians.)  Result Comment LABS - This specimen was collected through an   - indwelling catheter or arterial line.  - A minimum of 36ms of blood was wasted prior    - to collecting the sample.  Interpret  - results with caution.  Result(s) reported on 07 Aug 2013 at 07:46AM.  Routine Coag:  29-Apr-15 06:23   Activated PTT (APTT)  61.9 (A  HCT value >55% may artifactually increase the APTT. In one study, the increase was an average of 19%. Reference: "Effect on Routine and Special Coagulation Testing Values of Citrate Anticoagulant Adjustment in Patients with High HCT Values." American Journal of Clinical Pathology 2006;126:400-405.)    14:13   Activated PTT (APTT)  74.9 (A HCT value >55% may artifactually increase the APTT. In one study, the increase was an average of 19%. Reference: "Effect on Routine and Special Coagulation Testing Values of Citrate Anticoagulant Adjustment in Patients with High HCT Values." American Journal of Clinical Pathology 2006;126:400-405.)  Routine Hem:  29-Apr-15 07:07   Platelet Count (CBC)  137  Hemoglobin (CBC)  10.4 (Result(s) reported on 07 Aug 2013 at 07:46AM.)   Radiology Results: [place]    16-Apr-15 17:12, [place] Bilateral  [place] Bilateral   REASON FOR EXAM:    acute renal failure.  COMMENTS:       PROCEDURE: [place] - [place]IDNEY  - Jul 25 2013  5:12PM     CLINICAL DATA:  Acute renal insufficiency    EXAM:  RENAL/URINARY TRACT ULTRASOUND COMPLETE    COMPARISON:  None.    FINDINGS:  Right Kidney:  Length: 10.8 cm.. Parenchyma is echogenic compared to adjacent  liver. No focal lesion or hydronephrosis.    Left Kidney:    Length: 11.8 cm..  No focal lesion or hydronephrosis.    Bladder:    Incompletely distended, unremarkable.  Left ureteraljet identified.    Small amount abdominal ascites is incidentally noted.     IMPRESSION:  1. Echogenic renal parenchyma, a nonspecific indicator of medical  renal disease.  2. Negative for hydronephrosis.  3. Abdominal ascites.      Electronically Signed    By: DArne ClevelandM.D.    On: 07/25/2013 17:13         Verified By: DKandis Cocking M.D.,    27-Apr-15 12:00, [place] Flow Doppler Low Extrem Bilat (Legs)  [place] Flow Doppler Low Extrem Bilat (Legs)   REASON FOR EXAM:    Call Report [id number] Eval for  DVT  edema  COMMENTS:       PROCEDURE: [place] - [place]orea[place]OPPLER LOW EXTR BILATERAL  - Aug 05 2013 12:00PM     CLINICAL DATA:  Bilateral lower extremity edema and history of lung  carcinoma.    EXAM:  BILATERAL LOWER EXTREMITY VENOUS DOPPLER ULTRASOUND    TECHNIQUE:  Gray-scale sonography with graded compression, as well as color  Doppler and duplex ultrasound were performed to evaluate the lower  extremity deep venous systems from the level of thecommon femoral  vein and including the common femoral, femoral, profunda femoral,  popliteal and calf veins including the posterior tibial, peroneal  and gastrocnemius veins when visible. The superficial great  saphenous vein was also interrogated. Spectral Doppler was utilized  to evaluate flow at rest and with distal augmentation maneuvers in  the common femoral, femoral and popliteal veins.    COMPARISON:  None.    FINDINGS:  RIGHT LOWER EXTREMITY    Common Femoral Vein: The lower common femoral vein demonstrates  occlusive thrombus which does not extend across the saphenofemoral  junction.  Saphenofemoral Junction: No evidence of thrombus. Normal  compressibility and flow on color Doppler imaging.    Profunda Femoral Vein: Proximal segment shows occlusive thrombus.  Further distally, the profunda femoral vein is open.    Femoral Vein: Occlusive thrombus is identified throughout the  femoral vein of the thigh.    Popliteal Vein: Near occlusive thrombus throughout the popliteal  vein with a small amount of flow detected along the posterior margin  of the vein.    Calf Veins: Thrombus identified and posterior tibial and peroneal  veins.  Superficial Great Saphenous Vein: No evidence of thrombus. Normal  compressibility and flow on color Doppler imaging.    Venous Reflux:  None.    Other Findings:  None.    LEFT LOWER EXTREMITY    Common Femoral Vein: Nonocclusive thrombus identified.    Saphenofemoral Junction: Nonocclusive  thrombus identified at the  saphenofemoral junction.    Profunda Femoral Vein: Nonocclusive thrombus identified in a  profunda femoral branch.    Femoral Vein: No evidence of thrombus. Normal compressibility,  respiratory phasicity and response to augmentation.    Popliteal Vein: No evidence of thrombus. Normal compressibility,  respiratory phasicity and response to augmentation.    Calf Veins: No evidence of thrombus. Normal compressibility and flow  on color Doppler imaging.    Superficial Great Saphenous Vein: No evidence of thrombus. Normal  compressibility and flow on color Doppler imaging.    Venous Reflux:  None.  Other Findings:  None.     IMPRESSION:  Bilateral DVT, right greater than left. The right lower extremity  demonstrates extensive DVT extending from the common femoral vein  into the calf. Nonocclusive thrombus is identified in the left  common femoral and profunda femoral veins.      Electronically Signed    By: Aletta Edouard M.D.    On: 08/05/2013 13:42         Verified By: Azzie Roup, M.D.,  [place]:    13-Apr-15 15:10, CT Chest Without Contrast  PACS Image     27-Apr-15 12:00, [place] Color Flow Doppler Low Extrem Bilat (Legs)  PACS Image   CT:    13-Apr-15 15:10, CT Chest Without Contrast  CT Chest Without Contrast   REASON FOR EXAM:    eval the pna.  sob.  hx of lung ca now on radiation  COMMENTS:       PROCEDURE: CT  - CT CHEST WITHOUT CONTRAST  - Jul 22 2013  3:10PM     CLINICAL DATA:  Right-sided lung cancer. Currently getting radiation  therapy. COPD. Spontaneous left-sided pneumothorax. Status post  video-assisted thorascopic surgery and pleurodesis in 2013. Cough.  Shortness of breath for 5 days. Chills. Evaluate pneumonia.    EXAM:  CT CHEST WITHOUT CONTRAST    TECHNIQUE:  Multidetector CT imaging of the chest was performed following the  standard protocol without IV contrast.    COMPARISON:  DG CHEST 1V PORT dated  07/21/2013; CT CHEST W/ CM dated  03/26/2012; DG CHEST 2V dated 04/07/2012    FINDINGS:  Lungs/Pleura: Patent airways to the lobar  level. The segmental  bronchi to the left lower lobe are compressed and nearly completely  obliterated.    Moderate centrilobular emphysema.    The right upper lobe lung nodule detailed on the CT of 03/26/2012 is  no longer identified and presumably has resolved with therapy.  The left upper lobe nodule described on the 03/26/2012 CT is also no  longer identified. Patchy dependent left upper lobe airspace  disease. Patchy areas of airspace disease with more dense  consolidation in the left lower lobe.    Small volume left-sided pleural fluid. Pleural thickening and  calcification, which may represent chronicity or the sequelae of  pleurodesis.    Right-sided pleural thickening without fluid.    Underline moderate centrilobular and paraseptal emphysema. Focal air  along the medial aspect of the right lower lobe on image 26/series 3  is favored to be related to bleb (Rather than a tiny area of  loculated pleural air).  Heart/Mediastinum: Normal heart size. No pericardial effusion. Left  paratracheal/AP window 1.7 cm node on image 23. This measures 1.4 cm  on 03/26/2012. 1.5 cm subcarinal node on image 29 is new. Hilar  regions poorly evaluated without intravenous contrast. Possible left  infrahilar soft tissue fullness on image 31/series 2.    Right cardiophrenic angle node which is mildly enlarged at 8 mm, new  since the prior CT .    Upper Abdomen: Normal imaged adrenal glands. Small volume  perihepatic ascites. Suggestion of upper abdominal peritoneal  thickening/nodularity. Example image 52/series 2 and image 60/series  2. Minimal fluid adjacent the greater curvature of the stomach.    Bones/Musculoskeletal: Multiple lytic lesions throughout the imaged  spine. Index lesion at the T8 level measures 2.4 cm on image  30/series 2. Superior endplate  lesion at T2 with mild secondary  compression deformity.     IMPRESSION:  1. Since 03/26/2012, development of multiple lytic lesions  throughout the bones, most consistent with metastatic disease. Given  the extent of osseous lesions, a metachronous malignancy such as  multiple myeloma would be a less likely consideration.  2. Patchy left upper and left lower lobe airspace disease which  could represent infection and/or radiation change. More dense  consolidation in the left lower lobe but which could also relate to  infection. A central obstructing mass and/or adenopathy cannot be  excluded, given infrahilar soft tissue fullness and lack of IV  contrast on the current exam.  3. New and progressive thoracic adenopathy which could be reactive  or represent metastatic disease.  4. Complex left-sided pleural effusion, likely related to prior  pleurodesis.  5. Small volume upper abdominal ascites with suspicion of  omental/peritoneal metastasis.  6. Nonemergent PET may be informative for further evaluation of  presumed metastatic disease.      Electronically Signed    By: Jeronimo Greaves M.D.    On: 07/22/2013 17:44       Verified By: Consuello Bossier, M.D.,   Assessment and Plan: Impression:   1,  carcinoma  of lung non-small cell type right upper lobe as well as left lingular mass with multiple positive lymph node progressing disease onchemoradiation therapy  markers are not available  patient  had   received treatment at [facility]..  patient has also developed multiple lytic area in the bone.  Most likely metastases from the lung however other tumor-like multiple myeloma cannot be ruled out.  Will check light chain  as well as SIEP Plan:  Recent multiple admissions feels patient had   pre renal failure.vein thrombosis for which patient underwent IVC filter placementdistention cause not known performance status the patient is   very poor    and I am not sure that  can  tolerate any form of chemotherapy at present time  molecular markers are not available .discussed   situation with patient and family, discuss situation with Dr. Vallery Ridge, palliative care physicianthis patient with you.  CT scan and ultrasound has been reviewed independently  Electronic Signatures: Desirea Mizrahi, Martie Lee (MD)  (Signed 29-Apr-15 15:45)  Authored: HISTORY OF PRESENT ILLNESS, PFSH, ROS, NURSING NOTES, PE, PAST MEDICAL HISTORY, ALLERGIES, HOME MEDICATIONS, LABS, OTHER RESULTS, ASSESSMENT AND PLAN   Last Updated: 29-Apr-15 15:45 by Jobe Gibbon (MD)

## 2014-08-02 NOTE — Consult Note (Signed)
PATIENT NAME:  Brett Francis, Brett Francis#:  [id number] DATE OF BIRTH:  25-Jan-1956  DATE OF CONSULTATION:  08/12/2013  REFERRING PHYSICIAN:  Enedina FinnerSona Patel, MD CONSULTING PHYSICIAN:  S.G. Evette CristalSankar, MD  REASON FOR CONSULTATION: Consideration of peritoneal Pleurx catheter.   HISTORY: This is a 59 year old male who has stage IV lung cancer who has been having recurring ascites causing significant difficulty. He has required several paracenteses over the last 2 weeks, the last time being about 3 days ago when he had 3.5 liters removed. The patient currently is scheduled to go to the hospice home and to facilitate his comfort a Pleurx catheter was requested. Further details are noted from the chart. The patient also has had bilateral DVT, but has had a filter placed and he is not on anticoagulation at this point.   PHYSICAL EXAMINATION: Reveals a weak, ill appearing male who was not in any acute distress at this time. He is awake and alert. His sclerae are anicteric. Conjunctiva is slightly pale. Abdominal exam reveals a distended abdomen with tympany in the middle and dullness on the side with shifting dullness consistent with ascitic fluid. There is some mild generalized tenderness over the abdomen.   DIAGNOSTIC DATA: His recent ultrasound was reviewed. His lab data reveals last pro time 18 and INR 1.6. PTT is 33.2.   IMPRESSION AND RECOMMENDATIONS: Given the overall situation and our plan for hospice care, I feel it is reasonable to place a peritoneal catheter for repeat aspirations to provide comfort to the patient. This was discussed in full with the patient, and he is agreeable, and he is scheduled to have this done on 08/13/2013 in the operating room under mild sedation and monitored care.   Thank you for allowing me to evaluate and help in the care of this patient.  ____________________________ S.Wynona LunaG. Sankar, MD sgs:sb D: 08/13/2013 08:30:15 ET T: 08/13/2013 09:01:25 ET JOB#: [id number]  cc: S.G. Evette CristalSankar, MD,  <Dictator> [facility] Orthopedic InstituteEEPLAPUTH Wynona LunaG SANKAR MD ELECTRONICALLY SIGNED 09/01/2013 8:56

## 2014-08-02 NOTE — Discharge Summary (Signed)
PATIENT NAME:  Brett Francis, Brett L MR#:  [id number] DATE OF BIRTH:  [birth year]  DATE OF ADMISSION:  08/05/2013 DATE OF DISCHARGE:  08/13/2013  Please review interim discharge summary dictated by Dr. Cherlynn KaiserSainani on May 1st.   This discharge summary covers hospital stay from May 2nd to May 5th.  PROCEDURE:  Insertion of tunnelled peritoneal catheter to drain malignant ascites by Dr. Evette CristalSankar, removal of about 1300 mL of fluid.   The patient being discharged to the hospice home.   MEDICATIONS AT DISCHARGE: 1.  Prochlorperazine 10 mg every 6 hours as needed.  2.  MiraLax 17 grams orally daily.  3.  Morphine 15 mg extended release 1 tablet b.i.d.  4.  Albuterol inhaler 2 puffs 4 times a day as needed.  5.  Roxicodone 5 mg every 4 hours as needed.  6.  Protonix 40 mg p.o. daily.  7.  Ensure t.i.d. with meals.    Abdominal peritoneal catheter care per protocol.   The patient is a NO CODE/DNR.   CONSULTATIONS:  Dr. Evette CristalSankar.   BRIEF SUMMARY OF HOSPITAL COURSE: [name] is a 59 year old African American gentleman with history of metastatic lung cancer, COPD, renal failure, severe sepsis, pneumonia, came to the hospital and was found to have bilateral DVT.   1.  The patient was admitted with bilateral acute deep vein thrombosis. His underlying risk factor was his cancer and immobility. He was initially on heparin drip; however, after overall discussion with palliative care  with family, they decided hospice home, and no aggressive treatment. The patient received IVC filter placement by Dr. Wyn Quakerew on 08/07/2013. No anticoagulations were prescribed.  2.  Abdominal distention, likely due to malignant recurrent ascites. The patient underwent paracentesis on 08/09/2013, with 3.5 liter removal. The patient had recurrent malignant ascites again, I spoke with Dr. Evette CristalSankar, who placed a Pleurx abdominal intraperitoneal catheter as part of  palliative treatment.  3.  Stage IV lung cancer. The patient is not a candidate for  further treatment.  4.  Chronic respiratory failure. Remained on oxygen. Continue Advair and Spiriva.  5.  Chronic renal failure. Creatinine is stable.  6.  Chronic back pain due to metastatic lung cancer. Continued on OxyContin and oxycodone.  7.  Severe malnutrition due to metastatic lung cancer. The patient was continued on Ensure. He has a very poor appetite.   Overall hospital stay was prolonged. The patient carries a poor prognosis. Family understands. The patient will be discharged to hospice home today.   ____________________________ Wylie HailSona A. Allena KatzPatel, MD sap:dmm D: 08/13/2013 14:04:00 ET T: 08/13/2013 22:45:26 ET JOB#: [id number]  cc: Margrete Delude A. Allena KatzPatel, MD, <Dictator> Willow OraSONA A Calyse Murcia MD ELECTRONICALLY SIGNED 09/01/2013 16:14

## 2014-08-02 NOTE — Op Note (Signed)
PATIENT NAME:  Brett Francis, [name] MR#:  [id number] DATE OF BIRTH:  January 21, 1956  DATE OF PROCEDURE:  08/07/2013  PREOPERATIVE DIAGNOSES: 1.  Bilateral lower extremity deep vein thrombosis.  2.  Stage 4 lung cancer with poor pulmonary reserve.   POSTOPERATIVE DIAGNOSES: 1.  Bilateral lower extremity deep vein thrombosis.  2.  Stage 4 lung cancer with poor pulmonary reserve.   PROCEDURE:   1.  Ultrasound guidance for vascular access, right femoral vein.  2.  Catheter placement into inferior vena cava from right femoral venous approach.  3.  Inferior venacavogram.  4.  Placement of a Bard-Denali IVC filter.   SURGEON: Annice NeedyJason S. Fabion Gatson, M.D.   ANESTHESIA: Local with moderate conscious sedation.   ESTIMATED BLOOD LOSS: Minimal.   FLUOROSCOPY TIME: Less than 1 minute.   CONTRAST USED:  15 mL.   INDICATION FOR PROCEDURE: A 59 year old African American male with bilateral lower extremity deep vein thrombosis. He has large-volume deep vein thrombosis bilaterally by duplex. He has a very poor pulmonary reserve with his stage 4 lung cancer, requiring oxygen, and is felt that any pulmonary embolus would be potentially fatal. For this reason, an IVC filter is to be placed. Risks and benefits were discussed. Informed consent was obtained.   DESCRIPTION OF PROCEDURE: The patient is brought to the vascular suite. Groins were shaved and prepped and a sterile surgical field was created. Interestingly, on our ultrasound, the left femoral vein was seen to be clearly occluded and the right femoral vein actually had no significant thrombus that we could see. This was opposite as what was read on the previous duplex, however, it is what we found.   I used the right femoral vein and accessed this under direct ultrasound guidance without difficulty with a Seldinger needle. A J-wire was then placed. After skin nick and dilatation, the delivery sheath was placed into the inferior vena cava and inferior  venacavogram was  performed. This showed the level of the renal veins at the bottom of L1. The filter was then deployed at L2. The sheath was removed. Pressure was held. Sterile dressing was placed. The patient tolerated the procedure well and was taken to the recovery room in stable condition.   ____________________________ Annice NeedyJason S. Jawara Latorre, MD jsd:cs D: 08/07/2013 12:14:00 ET T: 08/07/2013 14:04:45 ET JOB#: [id number]  cc: Annice NeedyJason S. Matheu Ploeger, MD, <Dictator> Annice NeedyJASON S Ailee Pates MD ELECTRONICALLY SIGNED 08/15/2013 14:25

## 2014-08-02 NOTE — Consult Note (Signed)
Patient with extensive BLE DVT found on duplex today.advanced lung cancer, and very poor pulmonary reserve already on oxygen.anticoagulation so far, but any PE would likely be fatal so we are asked to see about filter placementand benefits are discussed and he is agreeable to proceed with IVC filter placement.schedule for tomorrow am  Electronic Signatures: Annice Needyew, Jason S (MD)  (Signed on 28-Apr-15 17:50)  Authored  Last Updated: 28-Apr-15 17:50 by Annice Needyew, Jason S (MD)

## 2014-08-02 NOTE — Discharge Summary (Signed)
PATIENT NAME:  Brett Francis, Brett Francis MR#:  [id number] DATE OF BIRTH:  [birth year]  DATE OF ADMISSION:  07/21/2013 DATE OF DISCHARGE:  07/31/2013  PRIMARY CARE PHYSICIAN:  Dr. Sherrie Mustache.  NEW ONCOLOGIST: Dr. Doylene Canning.   NEPHROLOGIST:  Dr. Thedore Mins.  FINAL DIAGNOSES: 1. Severe sepsis with pneumonia.  2. Acute respiratory failure, now chronic.  3. Acute renal failure.  4. Nausea.  5. Hypomagnesemia.  6. Metastatic lung cancer.  7. Chronic obstructive pulmonary disease.  8. Constipation.  9. Weakness.   MEDICATIONS ON DISCHARGE: Include omeprazole 20 mg daily, prochlorperazine 10 mg every 6 hours as needed for nausea or vomiting, morphine 15 mg extended release every 12 hours as needed for pain, oxycodone 5 mg every 3-4 hours as needed for pain, albuterol CFC free 1 inhalation 4 times a day as needed for shortness of breath, Advair Diskus 250/50 one inhalation twice a day, Spiriva 18 mcg 1 inhalation daily, MiraLax 17 grams daily, Levaquin 500 mg 1 capsule once a day for 2 days, Ensure 240 mL 3 times a day with meals, prednisone 5 mg 1 tablet daily for 3 days.   Home health, physical therapy, nurse, nurse aide, help with meds, oxygen, and strength. Home oxygen: 3 liters nasal cannula.   DIET: Regular with Ensure 3 times a day, regular consistency.   ACTIVITY: As tolerated.   FOLLOWUP:  With Dr. Thedore Mins, nephrology, 1 week; Dr. Doylene Canning, 1 week; Dr. Sherrie Mustache 1-2 weeks.   HOSPITAL COURSE: The patient was admitted 07/21/2013, discharged 07/31/2013. Came in with cough, shortness of breath for 5 days, the patient was admitted with left-sided pneumonia, acute respiratory failure, tachycardia, lung cancer history. Was initially started on Levaquin and Zosyn and nebulizer treatments.   LABORATORY AND RADIOLOGICAL DATA DURING THE HOSPITAL COURSE: Included an EKG that showed sinus tachycardia Q waves anteriorly. Chest x-ray showed large left basilar opacity. Blood cultures negative for 5 days. Glucose 119, BUN 13,  creatinine 0.85, sodium 135, potassium 4.3, chloride 99, CO2 of 29, calcium 9.8. Liver function tests: AST slightly elevated at 49. White blood cell count 7.1, H and H 14.8 and 45.7, platelet count of 357,000. Troponin negative. Other blood culture negative. Magnesium 1.5. CT scan of the chest showed since 03/26/2012 development of multiple lytic lesions throughout the bones, most consistent with metastatic disease, patchy left upper and left lower lobe airspace disease. Could represent infection or radiation change. Central obstructing mass and/or adenopathy cannot be excluded.  New and progressive thoracic adenopathy, which could be reactive or represent metastatic disease. Complex left-sided pleural effusion, likely related to prior pleurodesis. Creatinine on the 14th, 1.04. Creatinine on the 16th went up to 3.9. Went up to 4.09 on the 16th, later in the day. Repeat chest x-ray showed increasing left basilar and new right upper lobe infiltrates. Ultrasound of the kidneys: Medical renal disease, abdominal ascites. Urinalysis: 2+ blood. Urine protein electrophoresis was sent off.  Serum protein electrophoresis showed a high alpha I globulin, low albumin, and total protein. Creatinine on the 17th up at 4.58. Creatinine on the 18th down to 4.15. Creatinine on the 19th, 3.93. Creatinine on the 20th, 3.38. Creatinine on the 21st, 3.09. Creatinine on the 22nd, 2.66.   HOSPITAL COURSE PER PROBLEM LIST:  1. For the patient's severe sepsis with pneumonia, patient was initially started on Levaquin and Zosyn, and vancomycin was then added. Vancomycin was switched over to Zyvox when the patient's creatinine impairment happened. The patient completed most of the hospital course with IV medications and will  be given a couple more days of Levaquin upon discharge. Full course of pneumonia treatment was done. The patient was seen in consultation by Dr. Belia HemanKasa, pulmonary.  2. For the patient's acute respiratory failure, now  chronic, the patient was on high flow nasal cannula during the hospital course and was actually on a regular nasal cannula for the last 3 days of the hospital course and will go home on 3 liters nasal cannula secondary to persistent hypoxia. Could be reflecting of the lung cancer and/or pneumonia, but I think he will be chronic respiratory failure at this point.  3. Acute renal failure, likely ATN with the vancomycin. Creatinine had been improving on a daily basis. The patient was seen in consultation by the nephrologist and last seen by Dr. Thedore MinsSingh, who is okay with being discharged on a creatinine of 2.66 with close followup next week. The patient must stay hydrated.  4. Nausea.  This is intermittent during the hospital course.  5. Hypomagnesemia. This was replaced during the hospital course.  6. Metastatic lung cancer. The patient is interested in getting chemotherapy through our cancer center here. [facility] Will follow up in Dr. Aleda Granahoksi's office as outpatient. Dr. Doylene Canninghoksi did see in consultation. This is likely metastatic lung cancer to the bone.  7. COPD. Continue inhalers. Prescriptions written.  8. Constipation. Needs to be on MiraLax daily secondary to chronic pain medications.  9. Weakness. I did write the patient a script for a walker. Patient will be discharged home with home health.   TIME SPENT ON DISCHARGE: 40 minutes.    ____________________________ Herschell Dimesichard J. Renae GlossWieting, MD rjw:dd D: 07/31/2013 17:00:21 ET T: 07/31/2013 19:59:44 ET JOB#: [id number]  cc: Herschell Dimesichard J. Renae GlossWieting, MD, <Dictator> Demetrios Isaacsonald E. Sherrie MustacheFisher, MD Gerome SamJanak K. Doylene Canninghoksi, MD Mosetta PigeonHarmeet Singh, MD     Salley ScarletICHARD J Briea Mcenery MD ELECTRONICALLY SIGNED 08/02/2013 17:04

## 2014-08-02 NOTE — Op Note (Signed)
PATIENT NAME:  Brett Francis, Metro L MR#:  [id number] DATE OF BIRTH:  1955-06-04  DATE OF PROCEDURE:  08/13/2013  PREOPERATIVE DIAGNOSIS:  Malignant ascites.   POSTOPERATIVE DIAGNOSIS:  Malignant ascites.   OPERATION:  Insertion of peritoneal Pleurx tunneled catheter.   SURGEON:  Kathreen CosierS. G. Sankar, M.D.   ANESTHESIA:  Local containing 0.5% Marcaine and 1% Xylocaine in equal amounts.   COMPLICATIONS:  None.   ADDITIONAL INSTRUMENTS:  Ultrasound guidance.   PROCEDURE:  The patient was placed in the supine position on the operating table and adequately monitored by anesthesia.  The abdominal area was then prepped and draped out allowing exposure of the right side of the abdomen.  Ultrasound probe was used to identify the fluid in the abdominal cavity beneath a fairly thin abdominal wall.  Two small separate incisions were made 5 cm apart, one for the entrance of the catheter and one for the exit site.  After instillation of local anesthetic, the incisions were made and the intervening space infiltrated and this area was tunneled to allow for passage of the catheter through the exit site towards the entrance site.  The cuff was placed right underneath the exit site skin incision.  With ultrasound guidance and needle with Angiocath was positioned in the peritoneal cavity with withdrawal of ascitic fluid and then using the Seldinger technique the catheter was successfully positioned going into the peritoneal cavity and the outer sheath removed.  The catheter was then connected to suction and drained approximately 1250 to 1300 mL ascitic fluid.  There was significant improvement in the abdominal distention.  The patient experienced no hemodynamic changes during this.  The skin incision at the catheter entrance site was closed with subcuticular 3-0 Vicryl and the opening at the exit site was narrowed with a single 2-0 silk stitch.  Both incisions were covered with Dermabond and the sealed dressing was then placed with  a catheter coil into the dressing.  The patient subsequently was returned to the recovery room in stable condition.    ____________________________ S.Wynona LunaG. Sankar, MD sgs:ea D: 08/13/2013 15:53:22 ET T: 2013/06/25 01:58:21 ET JOB#: [id number]  cc: Timoteo ExposeS.G. Evette CristalSankar, MD, <Dictator> [facility] Center[facility] Wynona LunaG SANKAR MD ELECTRONICALLY SIGNED 08/29/2013 8:56

## 2014-08-02 NOTE — Consult Note (Signed)
Chief Complaint:  Subjective/Chief Complaint seen for abdominal distension possible sbo.  increased abd distension from yesterday, no abdominal pain or nausea. passing flatus no bm.   VITAL SIGNS/ANCILLARY NOTES: **Vital Signs.:   30-Apr-15 03:42  Temperature Temperature (F) 97.9  Celsius 36.6  Temperature Source oral  Pulse Pulse 113  Respirations Respirations 20  Systolic BP Systolic BP 967  Diastolic BP (mmHg) Diastolic BP (mmHg) 84  Mean BP 94  Pulse Ox % Pulse Ox % 95  Pulse Ox Activity Level  At rest  Oxygen Delivery 3L    09:25  Vital Signs Type Routine  Temperature Temperature (F) 98.4  Celsius 36.8  Pulse Pulse 114  Respirations Respirations 20  Systolic BP Systolic BP 591  Diastolic BP (mmHg) Diastolic BP (mmHg) 81  Mean BP 92  Pulse Ox % Pulse Ox % 91  Pulse Ox Activity Level  At rest  Oxygen Delivery 3L   Brief Assessment:  Cardiac Regular   Respiratory clear right  poor left, decreased bs left   Gastrointestinal details normal moderate distension, non-tender   Lab Results: Routine Chem:  30-Apr-15 05:09   Glucose, Serum  144  BUN  28  Creatinine (comp)  1.64  Sodium, Serum 138  Potassium, Serum 3.8  Chloride, Serum 104  CO2, Serum 26  Calcium (Total), Serum  10.2  Anion Gap 8  Osmolality (calc) 284  eGFR (African American)  53  eGFR (Non-African American)  46 (eGFR values <83m/min/1.73 m2 may be an indication of chronic kidney disease (CKD). Calculated eGFR is useful in patients with stable renal function. The eGFR calculation will not be reliable in acutely ill patients when serum creatinine is changing rapidly. It is not useful in  patients on dialysis. The eGFR calculation may not be applicable to patients at the low and high extremes of body sizes, pregnant women, and vegetarians.)  Routine Coag:  30-Apr-15 05:09   Activated PTT (APTT)  99.5 (A HCT value >55% may artifactually increase the APTT. In one study, the increase was an  average of 19%. Reference: "Effect on Routine and Special Coagulation Testing Values of Citrate Anticoagulant Adjustment in Patients with High HCT Values." American Journal of Clinical Pathology 2006;126:400-405.)   Radiology Results: XRay:    30-Apr-15 09:06, Abdomen Flat and Erect  Abdomen Flat and Erect   REASON FOR EXAM:    re-eval the sbo/ileus  COMMENTS:       PROCEDURE: DXR - DXR ABDOMEN 2 V FLAT AND ERECT  - Aug 08 2013  9:06AM     CLINICAL DATA:  Small bowel obstruction/ileus.    EXAM:  ABDOMEN - 2 VIEW    COMPARISON:  [place] RUQ/AS[place]ES dated 08/07/2013; DG  ABDOMEN 2V dated 08/07/2013; DG ABDOMEN 2V dated 08/06/2013; CT CHEST  W/O CM dated 07/22/2013    FINDINGS:  Residual mildly prominent gas-filled small bowel loops remain in the  central abdomen. Findings are more suggestive of ileus than a true  small bowel obstruction. Overall pattern is similar to 4/28 and  4/29. No free air is identified. IVC filter shows stable appearance.     IMPRESSION:  Stable residual mildly prominent small bowel loops. Findings are  more suggestiveof ileus than overt small bowel obstruction.      Electronically Signed    By: GAletta EdouardM.D.    On: 08/08/2013 09:11       Verified By: GAzzie Roup M.D.,  [place]    29-Apr-15 18:20, [place]  US Abdomen Limited Survey   REASON FOR EXAM:    distended abd. ascites eval. also liver  COMMENTS:   Body Site: Gallbladder, Liver, Common Bile Duct; Ascites   search - Abd.    PROCEDURE: [place]  - US ABDOMEN LIMITED SURVEY  - Aug 07 2013  6:20PM     CLINICAL DATA:  Ascites, abdominal distention.    EXAM:  US ABDOMEN LIMITED - RIGHT UPPER QUADRANT    COMPARISON:  None.    FINDINGS:  Gallbladder:  No gallstones or wall thickening visualized. No sonographic Murphy  sign noted.    Common bile duct:    Diameter: Measures 6.7 mm proximally but 4.9 mm distally. This is  within normal  limits.    Liver:    Heterogeneous echotexture is noted with slightly nodular contour  suggesting hepatic cirrhosis. Several hyperechoic areas are noted  within the liver, with the largest measuring 8 mm. Mild ascites is  noted around the liver.   IMPRESSION:  Mild ascites is noted around the liver. Hepatic parenchyma is  heterogeneous in echotexture with slightly nodular contour  suggesting hepatic cirrhosis. Several hyperechoic areas are noted  within the liver, the largest measuring 8 mm. Given the possibility  of hepatic cirrhosis, further evaluation with MRI is recommended to  evaluate for possible neoplasm or malignancy.      Electronically Signed    By: Sabino Dick M.D.    On: 08/07/2013 18:51         Verified By: Marveen Reeks, M.D.,   Assessment/Plan:  Assessment/Plan:  Assessment 1) abdominal distension-minimal ascites aroung liver.  liver appears nodular on [place], question of cirrhosis versus more extensive involvement of the liver from metastatic disease.  No apparent h/o etoh abuse. will check lfts.  Serial films showing stable pattern of likely mild ileus.   Plan as above, reduce narcotics as possible.   Electronic Signatures: Loistine Simas (MD)  (Signed 30-Apr-15 11:57)  Authored: Chief Complaint, VITAL SIGNS/ANCILLARY NOTES, Brief Assessment, Lab Results, Radiology Results, Assessment/Plan   Last Updated: 30-Apr-15 11:57 by Loistine Simas (MD)

## 2014-08-02 NOTE — Consult Note (Signed)
Chief Complaint:  Subjective/Chief Complaint seen for ileus, possible sbo.  increased distension today.  some increased abd pain today   VITAL SIGNS/ANCILLARY NOTES: **Vital Signs.:   01-May-15 12:50  Vital Signs Type Routine  Temperature Temperature (F) 98.5  Celsius 36.9  Pulse Pulse 114  Respirations Respirations 20  Systolic BP Systolic BP 118  Diastolic BP (mmHg) Diastolic BP (mmHg) 82  Mean BP 94  Pulse Ox % Pulse Ox % 95  Pulse Ox Activity Level  At rest  Oxygen Delivery 3L   Brief Assessment:  GEN cachectic   Cardiac Regular   Respiratory clear right poor  breath sounds left.   Gastrointestinal details normal No rebound tenderness  tender to palpation, increased distension, distant bowel sounds.   Lab Results: Routine Coag:  01-May-15 03:46   Activated PTT (APTT)  90.4 (A HCT value >55% may artifactually increase the APTT. In one study, the increase was an average of 19%. Reference: "Effect on Routine and Special Coagulation Testing Values of Citrate Anticoagulant Adjustment in Patients with High HCT Values." American Journal of Clinical Pathology 2006;126:400-405.)    11:46   Prothrombin  18.3  INR 1.6 (INR reference interval applies to patients on anticoagulant therapy. A single INR therapeutic range for coumarins is not optimal for all indications; however, the suggested range for most indications is 2.0 - 3.0. Exceptions to the INR Reference Range may include: Prosthetic heart valves, acute myocardial infarction, prevention of myocardial infarction, and combinations of aspirin and anticoagulant. The need for a higher or lower target INR must be assessed individually. Reference: The Pharmacology and Management of the Vitamin K  antagonists: the seventh ACCP Conference on Antithrombotic and Thrombolytic Therapy. Chest.2004 Sept:126 (3suppl): [id number]. A HCT value >55% may artifactually increase the PT.  In one study,  the increase was an average of  25%. Reference:  "Effect on Routine and Special Coagulation Testing Values of Citrate Anticoagulant Adjustment in Patients with High HCT Values." American Journal of Clinical Pathology 2006;126:400-405.)  Routine Hem:  01-May-15 03:46   WBC (CBC)  13.9  RBC (CBC)  4.03  Hemoglobin (CBC)  10.1  Hematocrit (CBC)  31.4  Platelet Count (CBC) 156  MCV  78  MCH  25.0  MCHC 32.1  RDW  19.6  Neutrophil % 94.2  Lymphocyte % 2.1  Monocyte % 3.3  Eosinophil % 0.1  Basophil % 0.3  Neutrophil #  13.1  Lymphocyte #  0.3  Monocyte # 0.5  Eosinophil # 0.0  Basophil # 0.0 (Result(s) reported on 09 Aug 2013 at 06:50AM.)   Assessment/Plan:  Assessment/Plan:  Assessment 1) ileus, multifactorial, meds, metastatic lung ca.   Plan 1) plans for hospice noted.  will sign off.  reconsult as needed.   Electronic Signatures: Barnetta ChapelSkulskie, [place] Churchill (MD)  (Signed 01-May-15 15:33)  Authored: Chief Complaint, VITAL SIGNS/ANCILLARY NOTES, Brief Assessment, Lab Results, Assessment/Plan   Last Updated: 01-May-15 15:33 by Barnetta ChapelSkulskie, Travaughn Vue (MD)

## 2014-08-02 NOTE — Consult Note (Signed)
Brief Consult Note: Diagnosis: partial SBO, abdominal distension.   Patient was seen by consultant.   Consult note dictated.   Recommend further assessment or treatment.   Orders entered.   Comments: Please see full GI consult [id number].  Patient with h/o metastatic lung cancer on background of COPD.  Admitted form dialysis with bilateral DVT, plans for IVC filter noted.  Patient with sx of intermittant occurance of abdominal distension for at least a week or 2.  Denies abdominal pain, no pain on abdominal exam.  DDX would include possibility of metastic lesion versus primary GI lesion affecting the pathway of the bowel versus metabolic derangement with resultant bowel dysfunction/ileus.  Recommend daily abd 2 way films, continue miralax daily, Obtain the results of recent CT abd done at [facility] about 2 weeks ago.  Following.  Electronic Signatures: Barnetta ChapelSkulskie, Martin (MD)  (Signed 28-Apr-15 19:57)  Authored: Brief Consult Note   Last Updated: 28-Apr-15 19:57 by Barnetta ChapelSkulskie, Martin (MD)

## 2014-08-02 NOTE — H&P (Signed)
PATIENT NAME:  Brett Francis, Brett Francis MR#:  [id number] DATE OF BIRTH:  October 12, 1955  DATE OF ADMISSION:  08/05/2013  PRIMARY CARE PHYSICIAN: Demetrios Isaacsonald E. Sherrie MustacheFisher, MD  ONCOLOGIST: Gerome SamJanak K. Choksi, MD  NEPHROLOGIST: Laverda SorensonSarath C. Kolluru, MD  CHIEF COMPLAINT: Lower extremity swelling and acute DVT.   HISTORY OF PRESENT ILLNESS: This is a 59 year old male who presented to the hospital directly from his nephrologist office, as he was noted to have bilateral acute DVT. The patient was just recently discharged from the hospital about 5 days ago and treated for sepsis due to pneumonia. He has been doing well from a respiratory standpoint, but his oral intake has been poor and he has noticed some significant lower extremity swelling. He went to see his nephrologist today, who ordered Dopplers of his lower extremities, and he was noted to have bilateral acute DVT. He was sent over to the hospital for further evaluation. The patient denies any chest pain, any worsening shortness of breath, any hemoptysis, any nausea, vomiting, any abdominal pain, or any other associated symptoms. He does admit to poor p.o. intake, as he gets fairly full shortly after taking liquids or solids.   REVIEW OF SYSTEMS:    CONSTITUTIONAL: No documented fever. Positive weakness. Positive weight loss. No weight gain.  EYES: No blurred or double vision.  EARS, NOSE, THROAT: No tinnitus. No postnasal drip. No redness of the oropharynx.  RESPIRATORY: No cough, no wheeze, no hemoptysis. Positive COPD.  CARDIOVASCULAR: No chest pain, no orthopnea, no palpitations, no syncope.  GASTROINTESTINAL: No nausea, no vomiting, no diarrhea, no abdominal pain. No melena or hematochezia.  GENITOURINARY: No dysuria or hematuria.  ENDOCRINE: No polyuria, nocturia, heat or cold intolerance.  HEMATOLOGIC: No anemia, no bruising, no bleeding.  INTEGUMENTARY: No rashes. No lesions.  MUSCULOSKELETAL: No arthritis, no swelling, no gout.  NEUROLOGIC: No numbness, no  tingling, no ataxia, no seizure-type activity.  PSYCHIATRIC: No anxiety, no insomnia, no ADD.   PAST MEDICAL HISTORY: Consistent with history of lung cancer, COPD, acute renal failure, chronic back pain due to metastatic lung cancer, history of recent sepsis due to pneumonia, chronic constipation.   ALLERGIES: No known drug allergies.   SOCIAL HISTORY: Used to be a smoker. Does have a 20 to 30 pack-year smoking history, quit many years ago. No alcohol abuse. No illicit drug abuse. Lives at home with his wife.   FAMILY HISTORY: Mother is deceased, died from breast cancer. Father is alive, just got diagnosed with lung cancer.   CURRENT MEDICATIONS: As follows: Omeprazole 20 mg daily, prochlorperazine 10 mg q.6 hours as needed, MS Contin 15 mg b.i.d., oxycodone 5 mg every 3 to 4 hours as needed, albuterol inhaler 1 puff 4 times daily as needed, Advair 250/50 one puff b.i.d., Spiriva 1 puff daily, and MiraLax daily as needed.   PHYSICAL EXAMINATION: Presently is as follows:  VITAL SIGNS: Temperature 97.7, pulse 122, respirations 20, blood pressure 110/75, sats 99% on 3 liters.  GENERAL: He is a pleasant-appearing male, in no apparent distress.  HEAD, EYES, EARS, NOSE, THROAT: He is atraumatic, normocephalic. Extraocular muscles are intact. Pupils equal and reactive to light. Sclerae anicteric. No conjunctival injection. No pharyngeal erythema.  NECK: Supple. There is no jugular venous distention. No bruits, no lymphadenopathy, no thyromegaly.  HEART: Regular, tachycardic. No murmurs, no rubs, no clicks.  LUNGS: He has poor inspiratory and expiratory effort. No rales, no rhonchi, no wheezes. No dullness to percussion. Negative use of accessory muscles.  ABDOMEN: Soft, flat, nontender,  nondistended. Has good bowel sounds. No hepatosplenomegaly appreciated.  EXTREMITIES: No evidence of any cyanosis or clubbing, +1 pitting edema from the knees to the ankles bilaterally, +2 pedal and radial pulses  bilaterally.  NEUROLOGIC: The patient is alert, awake, and oriented x  with no focal motor or sensory deficits appreciated bilaterally.  SKIN: Moist and warm with no rashes appreciated.  LYMPHATIC: There is no cervical or axillary lymphadenopathy.   LABORATORY DATA: Currently still pending.   RADIOLOGICAL DATA: The patient did have Dopplers of his lower extremities done, which showed bilateral DVT, right greater than the left.   ASSESSMENT AND PLAN: This is a 59 year old male with a history of lung cancer, chronic obstructive pulmonary disease, acute renal failure, chronic pain due to metastatic lung cancer, presented to the hospital from his nephrologist's office due to lower extremity swelling and noted to have acute bilateral deep vein thromboses.  1.  Bilateral acute deep vein thromboses: This is likely the cause of the patient's lower extremity swelling. The likely risk factor is his underlying malignancy. Given his renal failure, I will treat the patient with heparin nomogram for now. Once his renal function improves, he likely could be switched over to Xarelto or even Pradaxa or Eliquis. I will follow him clinically for now.  2.  Acute renal failure: This is likely secondary to poor p.o. intake. His current lab work is pending. His creatinine on discharge 5 days ago was 2.6. If his creatinine is worse, I will hydrate him with IV fluids. Follow BUN and creatinine and urine output. Renal dose meds. Avoid nephrotoxins. I will get a nephrology consult.  3.  Lung cancer: This is recurrent in nature. The patient is supposed to follow with Dr. Doylene Canning as an outpatient.  4.  Chronic obstructive pulmonary disease: No evidence of acute exacerbation. Continue his Advair and Spiriva.  5.  Chronic back pain due to his metastatic lung cancer: I will continue his OxyContin and oxycodone for pain control.  6.  Gastroesophageal reflux disease: Continue Protonix.   CODE STATUS: The patient is a full code.    TIME SPENT: 50 minutes.    ____________________________ Rolly Pancake. Cherlynn Kaiser, MD vjs:jcm D: 08/05/2013 14:00:07 ET T: 08/05/2013 14:29:56 ET JOB#: [id number]  cc: Rolly Pancake. Cherlynn Kaiser, MD, <Dictator> Houston Siren MD ELECTRONICALLY SIGNED 08/21/2013 14:03

## 2014-08-02 NOTE — H&P (Signed)
PATIENT NAME:  Brett Francis, Kordell L MR#:  [id number] DATE OF BIRTH:  [birth year]  DATE OF ADMISSION:  07/21/2013  PRIMARY CARE PHYSICIAN:  Dr. Sherrie MustacheFisher  REFERRING PHYSICIAN:  Dr. Carollee MassedKaminski  CHIEF COMPLAINT: Cough, shortness of breath for 5 days.   HISTORY OF PRESENT ILLNESS: A 59 year old African-American male with history of lung cancer, COPD, spontaneous pneumothorax, presented to the ED with the above chief complaint. The patient is alert, awake, oriented, in no acute distress. The patient said he started to have cough, shortness of breath for the past 5 days. The patient denies any fever, but has chills. The patient denies any wheezing or hemoptysis. The patient said he has a dry cough without sputum. The patient is status post radiation 2 days ago. His O2 sat was 80s in the ED. Heart rate was 130. Chest x-ray showed left-sided pneumonia. The patient was treated with antibiotics and admitted for pneumonia.   PAST MEDICAL HISTORY: COPD, right-sided lung cancer, non-small cell lung cancer, spontaneous left pneumothorax.   SURGICAL HISTORY: Left video-assisted thoracoscopy surgery and talc pleurodesis in 2013.  SOCIAL HISTORY: Quit smoking after surgery since 2013. No alcohol drinking or illicit drugs.   FAMILY HISTORY: Sister has breast cancer. No hypertension, diabetes, heart attack, or stroke in his family.   ALLERGIES: None.  HOME MEDICATIONS: Omeprazole 20 mg p.o. daily, Klor-Con 20 mEq p.o. b.i.d., prochlorperazine 10 mg every 6 hours p.r.n.   REVIEW OF SYSTEMS:    CONSTITUTIONAL: The patient denies any fevers, but has chills, weakness.  EYES: No double vision or blurred vision.  ENT: No postnasal drip, slurred speech or dysphagia.  CARDIOVASCULAR: No chest pain, palpitation, orthopnea, nocturnal dyspnea. No leg edema.  PULMONARY: Positive for cough and shortness of breath. No sputum, wheezing or hemoptysis. GASTROINTESTINAL: No abdominal pain, nausea, vomiting, diarrhea. No melena or  bloody stool.  GENITOURINARY: No dysuria, hematuria, or incontinence.  SKIN: No rash or jaundice.  NEUROLOGIC: No syncope, loss of consciousness or seizure.  HEMATOLOGIC: No easy bruising or bleeding.  ENDOCRINE: No polyuria, polydipsia, heat or cold intolerance.  SKIN: No rash or jaundice.   VITAL SIGNS: Temperature 97.7, blood pressure 126/82, pulse 120, O2 saturation 97% on oxygen, respirations 26, then decreased to 24.   PHYSICAL EXAMINATION: GENERAL: The patient is alert, awake, oriented, in no acute distress.  HEENT: Pupils round, equal, react to light and accommodation.  NECK: Supple. No JVD or carotid bruits. No lymphadenopathy. No thyromegaly. CARDIOVASCULAR: S1, S2. Regular rate and rhythm. No murmurs, gallop.  PULMONARY: Bilateral air entry. Very weak breath sounds on the left side, right side breath sounds are normal. No wheezing or crackles. No use of accessory muscles to breathe.  ABDOMEN: Soft. No distention or tenderness. No organomegaly. Bowel sounds present.  EXTREMITIES: No edema, clubbing or cyanosis. No calf tenderness. Bilateral pedal pulses present.  SKIN: No rash or jaundice.  NEUROLOGY: Alert and oriented x 3. No focal deficit. Power 5/5. Sensation intact.   LABORATORY DATA: Troponin less than 0.02. WBC 7.1, hemoglobin 14.8, platelets 357. Glucose 119, BUN 13, creatinine 0.85, sodium 135, potassium 4.3, chloride 99, bicarb 29. Chest x-ray shows a large left basilar opacity consistent with pneumonia or atelectasis, with associated pleural effusion. EKG shows sinus tachycardia at 134 BPM.   IMPRESSIONS: 1.  Left-sided pneumonia.  2.  Hypoxia.  3.  Tachycardia. 4.  Lung cancer.  5.  Chronic obstructive pulmonary disease.   PLAN OF TREATMENT: The patient will be admitted to medical floor. We will start  Levaquin and Zosyn. Follow up CBC, blood culture, sputum culture. We will give Xopenex nebulizer treatment.   I discussed patient's condition and plan of treatment  with the patient and patient's wife and son, and other family member.   The patient wants FULL CODE.   TIME SPENT: About 58 minutes.    ____________________________ Shaune Pollack, MD qc:mr D: 07/21/2013 15:25:53 ET T: 07/21/2013 17:12:08 ET JOB#: [id number]  cc: Shaune Pollack, MD, <Dictator> Shaune Pollack MD ELECTRONICALLY SIGNED 07/22/2013 10:31

## 2015-03-09 IMAGING — CR DG ABDOMEN 2V
1 series · 2 of 2 positions shown · non-contrast
Comparison: US ABDOMEN LIMITED RUQ/ASCITES dated 08/07/2013;

CLINICAL DATA: Small bowel obstruction/ileus.

EXAM:
ABDOMEN - 2 VIEW

[Series 7: x abdomen supine · 0.14mm/px · 2 of 2 slices shown]
[im 1/2]
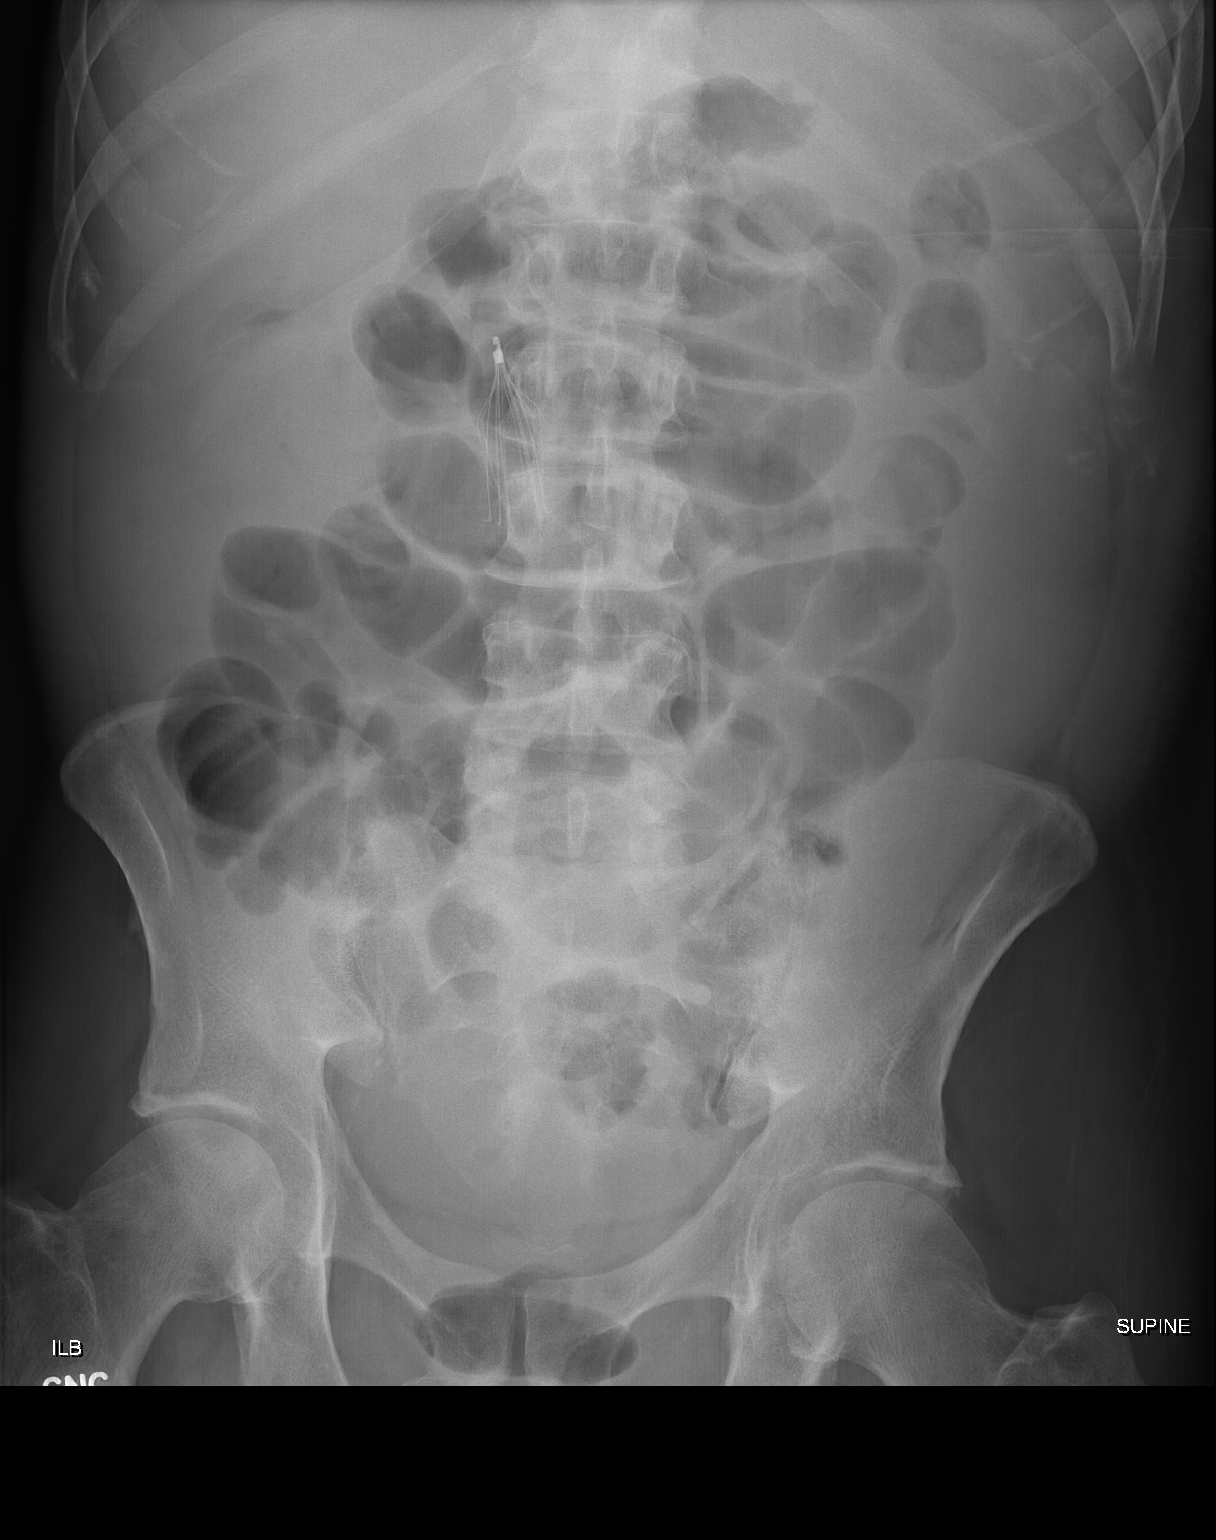
[im 2/2]
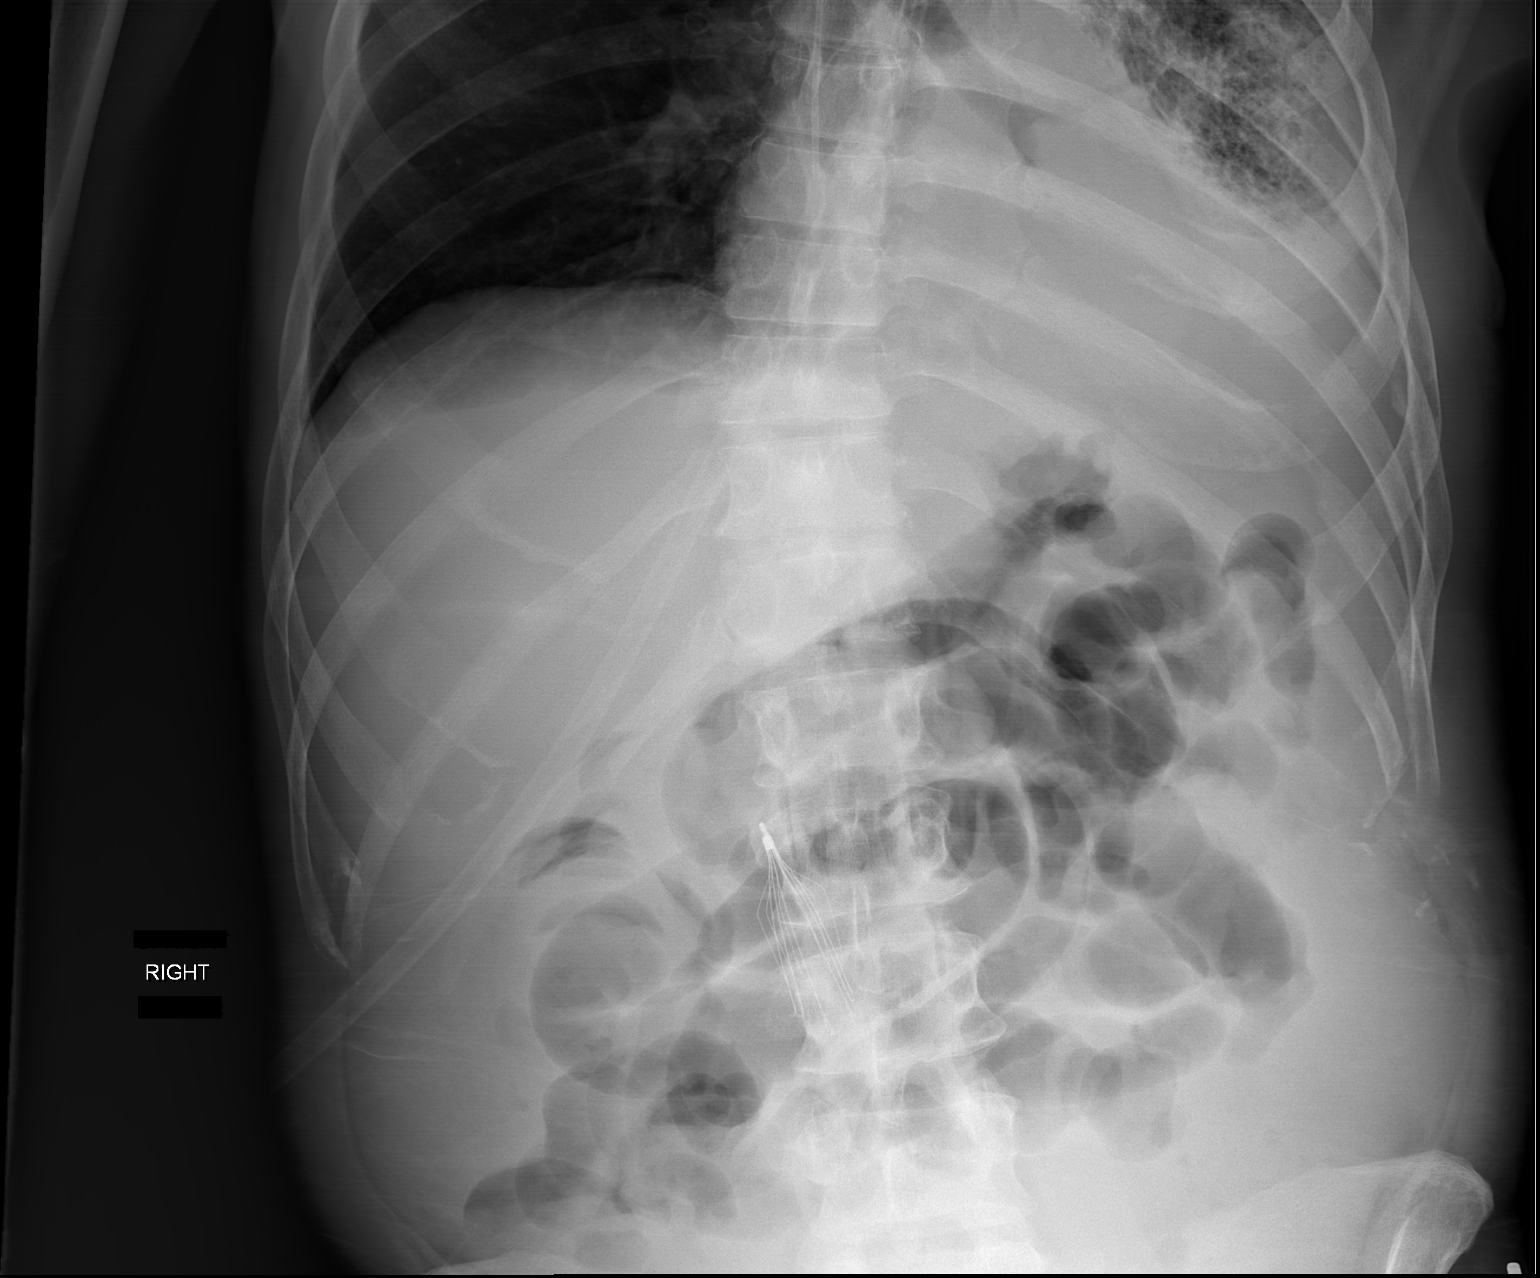

[2 of 2 positions shown; findings below may reference images not displayed]

DG
ABDOMEN 2V dated 08/07/2013; DG ABDOMEN 2V dated 08/06/2013; CT CHEST
W/O CM dated 07/22/2013
FINDINGS: Residual mildly prominent gas-filled small bowel loops remain in the
central abdomen. Findings are more suggestive of ileus than a true
small bowel obstruction. Overall pattern is similar to [DATE] and
[DATE]. No free air is identified. IVC filter shows stable appearance.
IMPRESSION: Stable residual mildly prominent small bowel loops. Findings are
more suggestive of ileus than overt small bowel obstruction.
